# Patient Record
Sex: Female | Born: 1985
Health system: Southern US, Community
[De-identification: ages and names within clinical notes are randomized; demographics above are authoritative.]

## PROBLEM LIST (undated history)

## (undated) DIAGNOSIS — R011 Cardiac murmur, unspecified: Secondary | ICD-10-CM

## (undated) DIAGNOSIS — D649 Anemia, unspecified: Secondary | ICD-10-CM

## (undated) HISTORY — DX: Anemia, unspecified: D64.9

## (undated) HISTORY — PX: TUBAL LIGATION: SHX77

## (undated) HISTORY — DX: Cardiac murmur, unspecified: R01.1

---

## 2017-05-17 ENCOUNTER — Encounter (HOSPITAL_BASED_OUTPATIENT_CLINIC_OR_DEPARTMENT_OTHER): Payer: Self-pay

## 2017-05-17 ENCOUNTER — Other Ambulatory Visit: Payer: Self-pay

## 2017-05-17 DIAGNOSIS — M25512 Pain in left shoulder: Secondary | ICD-10-CM | POA: Diagnosis not present

## 2017-05-17 DIAGNOSIS — Y999 Unspecified external cause status: Secondary | ICD-10-CM | POA: Insufficient documentation

## 2017-05-17 DIAGNOSIS — Y92410 Unspecified street and highway as the place of occurrence of the external cause: Secondary | ICD-10-CM | POA: Diagnosis not present

## 2017-05-17 DIAGNOSIS — S161XXA Strain of muscle, fascia and tendon at neck level, initial encounter: Secondary | ICD-10-CM | POA: Insufficient documentation

## 2017-05-17 DIAGNOSIS — Y9389 Activity, other specified: Secondary | ICD-10-CM | POA: Insufficient documentation

## 2017-05-17 DIAGNOSIS — S199XXA Unspecified injury of neck, initial encounter: Secondary | ICD-10-CM | POA: Diagnosis present
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
## 2017-05-18 ENCOUNTER — Emergency Department (HOSPITAL_BASED_OUTPATIENT_CLINIC_OR_DEPARTMENT_OTHER): Payer: No Typology Code available for payment source

## 2017-05-18 ENCOUNTER — Emergency Department (HOSPITAL_BASED_OUTPATIENT_CLINIC_OR_DEPARTMENT_OTHER)
Admission: EM | Admit: 2017-05-18 | Discharge: 2017-05-18 | Disposition: A | Discharge status: Home / Self Care | Payer: No Typology Code available for payment source | Attending: Emergency Medicine | Admitting: Emergency Medicine

## 2017-05-18 DIAGNOSIS — M25512 Pain in left shoulder: Secondary | ICD-10-CM

## 2017-05-18 DIAGNOSIS — S161XXA Strain of muscle, fascia and tendon at neck level, initial encounter: Secondary | ICD-10-CM

## 2017-05-18 MED ORDER — CYCLOBENZAPRINE HCL 10 MG PO TABS: 10.0000 mg | ORAL_TABLET | Freq: Three times a day (TID) | ORAL | 0 refills | Status: DC | PRN

## 2017-05-18 MED ORDER — IBUPROFEN 400 MG PO TABS
600.0000 mg | ORAL_TABLET | Freq: Once | ORAL | Status: AC
  Administered 2017-05-18: 02:00:00 600 mg via ORAL
  Filled 2017-05-18: qty 1

## 2017-05-18 MED ORDER — CYCLOBENZAPRINE HCL 10 MG PO TABS
10.0000 mg | ORAL_TABLET | Freq: Once | ORAL | Status: AC
  Administered 2017-05-18: 10 mg via ORAL
  Filled 2017-05-18: qty 1

## 2017-05-18 NOTE — Discharge Instructions (Signed)
Apply ice several times a day. Take ibuprofen, naproxen, or acetaminophen as needed for pain. °
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
## 2017-06-01 ENCOUNTER — Encounter (HOSPITAL_BASED_OUTPATIENT_CLINIC_OR_DEPARTMENT_OTHER): Payer: Self-pay | Admitting: Emergency Medicine

## 2017-06-01 ENCOUNTER — Emergency Department (HOSPITAL_BASED_OUTPATIENT_CLINIC_OR_DEPARTMENT_OTHER)
Admission: EM | Admit: 2017-06-01 | Discharge: 2017-06-01 | Disposition: A | Discharge status: Home / Self Care | Payer: Self-pay | Attending: Emergency Medicine | Admitting: Emergency Medicine

## 2017-06-01 ENCOUNTER — Other Ambulatory Visit: Payer: Self-pay

## 2017-06-01 DIAGNOSIS — Y929 Unspecified place or not applicable: Secondary | ICD-10-CM | POA: Insufficient documentation

## 2017-06-01 DIAGNOSIS — Y9389 Activity, other specified: Secondary | ICD-10-CM | POA: Insufficient documentation

## 2017-06-01 DIAGNOSIS — S0501XA Injury of conjunctiva and corneal abrasion without foreign body, right eye, initial encounter: Secondary | ICD-10-CM | POA: Insufficient documentation

## 2017-06-01 DIAGNOSIS — H53141 Visual discomfort, right eye: Secondary | ICD-10-CM | POA: Insufficient documentation

## 2017-06-01 DIAGNOSIS — W504XXA Accidental scratch by another person, initial encounter: Secondary | ICD-10-CM | POA: Insufficient documentation

## 2017-06-01 DIAGNOSIS — Y998 Other external cause status: Secondary | ICD-10-CM | POA: Insufficient documentation

## 2017-06-01 MED ORDER — TETRACAINE HCL 0.5 % OP SOLN
2.0000 [drp] | Freq: Once | OPHTHALMIC | Status: AC
  Administered 2017-06-01: 2 [drp] via OPHTHALMIC
  Filled 2017-06-01: qty 4

## 2017-06-01 MED ORDER — ERYTHROMYCIN 5 MG/GM OP OINT: TOPICAL_OINTMENT | OPHTHALMIC | 0 refills | Status: DC

## 2017-06-01 MED ORDER — FLUORESCEIN SODIUM 1 MG OP STRP
1.0000 | ORAL_STRIP | Freq: Once | OPHTHALMIC | Status: AC
  Administered 2017-06-01: 1 via OPHTHALMIC
  Filled 2017-06-01: qty 1

## 2017-06-01 MED FILL — ERYTHROMYCIN EYE OINTMENT: 5 | 5 days supply | Qty: 4 | Fill #0
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
## 2017-06-01 NOTE — Discharge Instructions (Addendum)
Call and schedule appointment to follow-up with the ophthalmologist today or tomorrow.  Use the erythromycin ointment as prescribed.  Return immediately for any worsening symptoms, changes in your vision or for any concerns.
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
## 2018-04-29 DIAGNOSIS — N898 Other specified noninflammatory disorders of vagina: Secondary | ICD-10-CM | POA: Diagnosis not present

## 2018-06-22 DIAGNOSIS — M531 Cervicobrachial syndrome: Secondary | ICD-10-CM | POA: Diagnosis not present

## 2018-06-22 DIAGNOSIS — M9901 Segmental and somatic dysfunction of cervical region: Secondary | ICD-10-CM | POA: Diagnosis not present

## 2018-06-22 DIAGNOSIS — M5386 Other specified dorsopathies, lumbar region: Secondary | ICD-10-CM | POA: Diagnosis not present

## 2018-06-22 DIAGNOSIS — M9902 Segmental and somatic dysfunction of thoracic region: Secondary | ICD-10-CM | POA: Diagnosis not present

## 2018-06-24 DIAGNOSIS — M5386 Other specified dorsopathies, lumbar region: Secondary | ICD-10-CM | POA: Diagnosis not present

## 2018-06-24 DIAGNOSIS — M531 Cervicobrachial syndrome: Secondary | ICD-10-CM | POA: Diagnosis not present

## 2018-06-24 DIAGNOSIS — M9902 Segmental and somatic dysfunction of thoracic region: Secondary | ICD-10-CM | POA: Diagnosis not present

## 2018-06-24 DIAGNOSIS — M9901 Segmental and somatic dysfunction of cervical region: Secondary | ICD-10-CM | POA: Diagnosis not present

## 2018-06-29 DIAGNOSIS — M531 Cervicobrachial syndrome: Secondary | ICD-10-CM | POA: Diagnosis not present

## 2018-06-29 DIAGNOSIS — M5386 Other specified dorsopathies, lumbar region: Secondary | ICD-10-CM | POA: Diagnosis not present

## 2018-06-29 DIAGNOSIS — M9902 Segmental and somatic dysfunction of thoracic region: Secondary | ICD-10-CM | POA: Diagnosis not present

## 2018-06-29 DIAGNOSIS — M9901 Segmental and somatic dysfunction of cervical region: Secondary | ICD-10-CM | POA: Diagnosis not present

## 2018-07-07 DIAGNOSIS — M9902 Segmental and somatic dysfunction of thoracic region: Secondary | ICD-10-CM | POA: Diagnosis not present

## 2018-07-07 DIAGNOSIS — M5386 Other specified dorsopathies, lumbar region: Secondary | ICD-10-CM | POA: Diagnosis not present

## 2018-07-07 DIAGNOSIS — M9901 Segmental and somatic dysfunction of cervical region: Secondary | ICD-10-CM | POA: Diagnosis not present

## 2018-07-07 DIAGNOSIS — M531 Cervicobrachial syndrome: Secondary | ICD-10-CM | POA: Diagnosis not present

## 2018-07-14 DIAGNOSIS — M9902 Segmental and somatic dysfunction of thoracic region: Secondary | ICD-10-CM | POA: Diagnosis not present

## 2018-07-14 DIAGNOSIS — M531 Cervicobrachial syndrome: Secondary | ICD-10-CM | POA: Diagnosis not present

## 2018-07-14 DIAGNOSIS — M9901 Segmental and somatic dysfunction of cervical region: Secondary | ICD-10-CM | POA: Diagnosis not present

## 2018-07-14 DIAGNOSIS — M5386 Other specified dorsopathies, lumbar region: Secondary | ICD-10-CM | POA: Diagnosis not present

## 2018-07-22 ENCOUNTER — Emergency Department (HOSPITAL_COMMUNITY)
Admission: EM | Admit: 2018-07-22 | Discharge: 2018-07-22 | Disposition: A | Discharge status: Home / Self Care | Payer: BLUE CROSS/BLUE SHIELD | Attending: Emergency Medicine | Admitting: Emergency Medicine

## 2018-07-22 ENCOUNTER — Encounter (HOSPITAL_COMMUNITY): Payer: Self-pay | Admitting: Emergency Medicine

## 2018-07-22 ENCOUNTER — Emergency Department (HOSPITAL_COMMUNITY): Payer: BLUE CROSS/BLUE SHIELD

## 2018-07-22 ENCOUNTER — Other Ambulatory Visit: Payer: Self-pay

## 2018-07-22 DIAGNOSIS — Z03818 Encounter for observation for suspected exposure to other biological agents ruled out: Secondary | ICD-10-CM | POA: Diagnosis not present

## 2018-07-22 DIAGNOSIS — R079 Chest pain, unspecified: Secondary | ICD-10-CM | POA: Diagnosis not present

## 2018-07-22 DIAGNOSIS — Z20828 Contact with and (suspected) exposure to other viral communicable diseases: Secondary | ICD-10-CM | POA: Diagnosis not present

## 2018-07-22 DIAGNOSIS — R0602 Shortness of breath: Secondary | ICD-10-CM | POA: Diagnosis not present

## 2018-07-22 DIAGNOSIS — R0781 Pleurodynia: Secondary | ICD-10-CM | POA: Insufficient documentation

## 2018-07-22 LAB — BASIC METABOLIC PANEL
Anion gap: 12 (ref 5–15)
BUN: 11 mg/dL (ref 6–20)
CO2: 22 mmol/L (ref 22–32)
Calcium: 9.5 mg/dL (ref 8.9–10.3)
Chloride: 104 mmol/L (ref 98–111)
Creatinine, Ser: 1.01 mg/dL — ABNORMAL HIGH (ref 0.44–1.00)
GFR calc Af Amer: >60 mL/min (ref >60)
GFR calc non Af Amer: >60 mL/min (ref >60)
Glucose, Bld: 104 mg/dL — ABNORMAL HIGH (ref 70–99)
Potassium: 3.2 mmol/L — ABNORMAL LOW (ref 3.5–5.1)
Sodium: 138 mmol/L (ref 135–145)

## 2018-07-22 LAB — C-REACTIVE PROTEIN: CRP: <0.8 mg/dL (ref <1.0)

## 2018-07-22 LAB — CBC
HCT: 35.3 % — ABNORMAL LOW (ref 36.0–46.0)
Hemoglobin: 11.8 g/dL — ABNORMAL LOW (ref 12.0–15.0)
MCH: 27.5 pg (ref 26.0–34.0)
MCHC: 33.4 g/dL (ref 30.0–36.0)
MCV: 82.3 fL (ref 80.0–100.0)
Platelets: 283 10*3/uL (ref 150–400)
RBC: 4.29 MIL/uL (ref 3.87–5.11)
RDW: 12.5 % (ref 11.5–15.5)
WBC: 10.3 10*3/uL (ref 4.0–10.5)
nRBC: 0 % (ref 0.0–0.2)

## 2018-07-22 LAB — SEDIMENTATION RATE: Sed Rate: 2 mm/hr (ref 0–22)

## 2018-07-22 LAB — TROPONIN I: Troponin I: <0.03 ng/mL (ref <0.03)

## 2018-07-22 LAB — D-DIMER, QUANTITATIVE (NOT AT ARMC): D-Dimer, Quant: 0.59 ug/mL-FEU — ABNORMAL HIGH (ref 0.00–0.50)

## 2018-07-22 MED ORDER — IOHEXOL 350 MG/ML SOLN
75.0000 mL | Freq: Once | INTRAVENOUS | Status: AC | PRN
  Administered 2018-07-22: 75 mL via INTRAVENOUS

## 2018-07-22 MED ORDER — KETOROLAC TROMETHAMINE 15 MG/ML IJ SOLN
15.0000 mg | Freq: Once | INTRAMUSCULAR | Status: AC
  Administered 2018-07-22: 15 mg via INTRAVENOUS
  Filled 2018-07-22: qty 1

## 2018-07-22 NOTE — ED Provider Notes (Signed)
[facility] EMERGENCY DEPARTMENT Provider Note   CSN: [id number] Arrival date & time: 07/22/18  0740    History   Chief Complaint Chief Complaint  Patient presents with   Shortness of Breath    HPI Sharon Cortez is a 33 y.o. female who presents with chest pain and shortness of breath.  No significant past medical history.  She reports an acute onset of chest pain that started overnight.  It hurts to take deep breaths.  She feels short of breath because she cannot get a deep breath from the pain. It feels better sitting forward, worse lying back. She tried her children's nebulizer treatments with minimal relief.  EMS was called this morning and they told her she had a "low-grade fever" so she took Tylenol. She denies any sick contacts.  She has been working from home and everyone has been well.  She has been visiting her boyfriend in [place] who does work with COVID patients.  She denies URI symptoms, vomiting, diarrhea, urinary symptoms, leg swelling. No recent surgery/travel/immobilization, hx of cancer, leg swelling, hemoptysis, prior DVT/PE, or hormone use.    HPI  History reviewed. No pertinent past medical history.  There are no active problems to display for this patient.   Past Surgical History:  Procedure Laterality Date   CESAREAN SECTION     TUBAL LIGATION       OB History   No obstetric history on file.      Home Medications    Prior to Admission medications   Medication Sig Start Date End Date Taking? Authorizing Provider  cyclobenzaprine (FLEXERIL) 10 MG tablet Take 1 tablet (10 mg total) by mouth 3 (three) times daily as needed for muscle spasms. 05/18/17   Dione Booze, MD  erythromycin ophthalmic ointment Place a 1/2 inch ribbon of ointment into the right lower eyelid 4 times daily x 5 days 06/01/17   Loren Racer, MD    Family History No family history on file.  Social History Social History   Tobacco Use   Smoking status:  Never Smoker   Smokeless tobacco: Never Used  Substance Use Topics   Alcohol use: No    Frequency: Never   Drug use: No     Allergies   Patient has no known allergies.   Review of Systems Review of Systems  Constitutional: Positive for fever. Negative for activity change and appetite change.  Respiratory: Positive for cough and shortness of breath. Negative for wheezing.   Cardiovascular: Positive for chest pain. Negative for palpitations and leg swelling.  Gastrointestinal: Positive for abdominal pain and nausea. Negative for diarrhea and vomiting.  Genitourinary: Negative for dysuria.  Neurological: Negative for syncope.  All other systems reviewed and are negative.    Physical Exam Updated Vital Signs BP (!) 124/91 (BP Location: Left Arm)    Pulse (!) 104    Temp 98.4 F (36.9 C) (Oral)    Resp 20    Ht  (1.676 m)    Wt 59 kg    LMP 07/01/2018    SpO2 99%    BMI 20.98 kg/m   Physical Exam Vitals signs and nursing note reviewed.  Constitutional:      General: She is not in acute distress.    Appearance: Normal appearance. She is well-developed. She is not ill-appearing.     Comments: Calm and cooperative. Well appearing. No distress  HENT:     Head: Normocephalic and atraumatic.  Eyes:  General: No scleral icterus.       Right eye: No discharge.        Left eye: No discharge.     Conjunctiva/sclera: Conjunctivae normal.     Pupils: Pupils are equal, round, and reactive to light.  Neck:     Musculoskeletal: Normal range of motion.  Cardiovascular:     Rate and Rhythm: Tachycardia present.  Pulmonary:     Effort: Pulmonary effort is normal. No respiratory distress.     Breath sounds: Normal breath sounds.  Abdominal:     General: There is no distension.     Palpations: Abdomen is soft.     Tenderness: There is no abdominal tenderness.  Musculoskeletal:     Right lower leg: No edema.     Left lower leg: No edema.  Skin:    General: Skin is warm  and dry.  Neurological:     Mental Status: She is alert and oriented to person, place, and time.  Psychiatric:        Behavior: Behavior normal.      ED Treatments / Results  Labs (all labs ordered are listed, but only abnormal results are displayed) Labs Reviewed  BASIC METABOLIC PANEL - Abnormal; Notable for the following components:      Result Value   Potassium 3.2 (*)    Glucose, Bld 104 (*)    Creatinine, Ser 1.01 (*)    All other components within normal limits  CBC - Abnormal; Notable for the following components:   Hemoglobin 11.8 (*)    HCT 35.3 (*)    All other components within normal limits  D-DIMER, QUANTITATIVE (NOT AT [facility]) - Abnormal; Notable for the following components:   D-Dimer, Quant 0.59 (*)    All other components within normal limits  NOVEL CORONAVIRUS, NAA (HOSPITAL ORDER, SEND-OUT TO REF LAB)  TROPONIN I  SEDIMENTATION RATE  C-REACTIVE PROTEIN    EKG EKG Interpretation  Date/Time:  Friday Jul 22 2018 08:00:49 EDT Ventricular Rate:  108 PR Interval:    QRS Duration: 74 QT Interval:  304 QTC Calculation: 408 R Axis:   68 Text Interpretation:  Sinus tachycardia Borderline T abnormalities, diffuse leads Abnormal ekg Confirmed by Gerhard Munch ([id number]) on 07/22/2018 8:02:45 AM   Radiology Ct Angio Chest Pe W/cm &/or Wo Cm  Result Date: 07/22/2018 CLINICAL DATA:  Shortness of breath for 1 day EXAM: CT ANGIOGRAPHY CHEST WITH CONTRAST TECHNIQUE: Multidetector CT imaging of the chest was performed using the standard protocol during bolus administration of intravenous contrast. Multiplanar CT image reconstructions and MIPs were obtained to evaluate the vascular anatomy. CONTRAST:  36mL OMNIPAQUE IOHEXOL 350 MG/ML SOLN COMPARISON:  None. FINDINGS: Cardiovascular: Satisfactory opacification of the pulmonary arteries to the segmental level. No evidence of pulmonary embolism. Normal heart size. No pericardial effusion. Mediastinum/Nodes: No enlarged  mediastinal, hilar, or axillary lymph nodes. Thyroid gland, trachea, and esophagus demonstrate no significant findings. Lungs/Pleura: Lungs are clear. No pleural effusion or pneumothorax. Upper Abdomen: No acute abnormality. Musculoskeletal: No chest wall abnormality. No acute or significant osseous findings. Review of the MIP images confirms the above findings. IMPRESSION: 1. No evidence of pulmonary embolus. 2. No acute cardiopulmonary disease. Electronically Signed   By: Elige Ko   On: 07/22/2018 12:22   Dg Chest Port 1 View  Result Date: 07/22/2018 CLINICAL DATA:  Chest pain. EXAM: PORTABLE CHEST 1 VIEW COMPARISON:  None. FINDINGS: The heart size and mediastinal contours are within normal limits. Both lungs are clear.  No pneumothorax or pleural effusion is noted. The visualized skeletal structures are unremarkable. IMPRESSION: No active disease. Electronically Signed   By: Lupita RaiderJames  Green Jr M.D.   On: 07/22/2018 08:52    Procedures Procedures (including critical care time)  Medications Ordered in ED Medications - No data to display   Initial Impression / Assessment and Plan / ED Course  I have reviewed the triage vital signs and the nursing notes.  Pertinent labs & imaging results that were available during my care of the patient were reviewed by me and considered in my medical decision making (see chart for details).  33 year old female presents with acute onset of pleuritic chest pain and shortness of breath starting early this morning along with low grade.  The pain is worse with deep breaths and is positional. DDx includes pericarditis, myocarditis, pleurisy, viral lung infection. She is tachycardic but otherwise vital signs are normal.  Her exam is overall unremarkable.  Will attain EKG, chest x-ray, blood work.  We will give Toradol for pain.  EKG is sinus tachycardia.  Chest x-ray is negative.  Blood work is pending.  11:07 AM Rechecked pt. She is feeling better after Toradol.   CBC is remarkable for mild anemia.  BMP is remarkable for mild hypokalemia.  Troponin is normal.  Inflammatory markers are normal.  Her d-dimer was slightly elevated.  Will obtain CTA of the chest to rule out PE.  CT of the chest is negative for PE or pneumonia.  Will treat with NSAIDs for the next several days.  A COVID-19 send test was obtained due to her contact with her boyfriend who is around COVID patients. She was advised to self-isolate until she obtains her results.  Lacy Duverneyyla Spillane was evaluated in Emergency Department on 07/22/2018 for the symptoms described in the history of present illness. She was evaluated in the context of the global COVID-19 pandemic, which necessitated consideration that the patient might be at risk for infection with the SARS-CoV-2 virus that causes COVID-19. Institutional protocols and algorithms that pertain to the evaluation of patients at risk for COVID-19 are in a state of rapid change based on information released by regulatory bodies including the CDC and federal and state organizations. These policies and algorithms were followed during the patient's care in the ED.   Final Clinical Impressions(s) / ED Diagnoses   Final diagnoses:  Pleuritic chest pain    ED Discharge Orders    None       Bethel BornGekas, Laiana Fratus Marie, PA-C 07/22/18 1446    Gerhard MunchLockwood, Robert, MD 07/22/18 1546

## 2018-07-22 NOTE — Discharge Instructions (Addendum)
All your testing that was done in the Emergency Dept today came back reassuring Take Ibuprofen 600mg  three times a day for the next 5 days for chest pain Rest and drink plenty of fluids We have sent off a Coronavirus test - the results will be available in 2-3 days. Please self-quarantine at home until you obtain the results Return if worsening

## 2018-07-22 NOTE — ED Notes (Signed)
This RN called main lab to check on process of D-dimer. Lab will now be processed,

## 2018-07-22 NOTE — ED Notes (Signed)
Gave pt crackers and gingerale 

## 2018-07-22 NOTE — ED Notes (Signed)
Pt states she is able to call family and keep them informed on her care.

## 2018-07-22 NOTE — ED Triage Notes (Signed)
Patient c/o central chest pain with inspiration onset of last night. Patient states she took one of her children's nebulizer treatments with mild relief. Patient states EMS came and saw her this am and reported a low grade fever and patient took 2 tylenol around 630 am.

## 2018-07-22 NOTE — ED Notes (Signed)
Pt arranged her family to come pick her up for D./c

## 2018-07-23 LAB — NOVEL CORONAVIRUS, NAA (HOSP ORDER, SEND-OUT TO REF LAB; TAT 18-24 HRS): SARS-CoV-2, NAA: NOT DETECTED

## 2018-07-29 DIAGNOSIS — M9901 Segmental and somatic dysfunction of cervical region: Secondary | ICD-10-CM | POA: Diagnosis not present

## 2018-07-29 DIAGNOSIS — M531 Cervicobrachial syndrome: Secondary | ICD-10-CM | POA: Diagnosis not present

## 2018-07-29 DIAGNOSIS — M5386 Other specified dorsopathies, lumbar region: Secondary | ICD-10-CM | POA: Diagnosis not present

## 2018-07-29 DIAGNOSIS — M9902 Segmental and somatic dysfunction of thoracic region: Secondary | ICD-10-CM | POA: Diagnosis not present

## 2018-08-12 DIAGNOSIS — M9901 Segmental and somatic dysfunction of cervical region: Secondary | ICD-10-CM | POA: Diagnosis not present

## 2018-08-12 DIAGNOSIS — M531 Cervicobrachial syndrome: Secondary | ICD-10-CM | POA: Diagnosis not present

## 2018-08-12 DIAGNOSIS — M9902 Segmental and somatic dysfunction of thoracic region: Secondary | ICD-10-CM | POA: Diagnosis not present

## 2018-08-12 DIAGNOSIS — M5386 Other specified dorsopathies, lumbar region: Secondary | ICD-10-CM | POA: Diagnosis not present

## 2018-08-19 DIAGNOSIS — Z1151 Encounter for screening for human papillomavirus (HPV): Secondary | ICD-10-CM | POA: Diagnosis not present

## 2018-08-19 DIAGNOSIS — Z124 Encounter for screening for malignant neoplasm of cervix: Secondary | ICD-10-CM | POA: Diagnosis not present

## 2018-08-19 DIAGNOSIS — Z113 Encounter for screening for infections with a predominantly sexual mode of transmission: Secondary | ICD-10-CM | POA: Diagnosis not present

## 2018-08-19 DIAGNOSIS — Z01419 Encounter for gynecological examination (general) (routine) without abnormal findings: Secondary | ICD-10-CM | POA: Diagnosis not present

## 2019-08-22 ENCOUNTER — Other Ambulatory Visit: Payer: Self-pay

## 2019-08-23 ENCOUNTER — Ambulatory Visit (INDEPENDENT_AMBULATORY_CARE_PROVIDER_SITE_OTHER): Payer: BC Managed Care – PPO | Admitting: Obstetrics and Gynecology

## 2019-08-23 ENCOUNTER — Encounter: Payer: Self-pay | Admitting: Obstetrics and Gynecology

## 2019-08-23 ENCOUNTER — Other Ambulatory Visit (HOSPITAL_COMMUNITY)
Admission: RE | Admit: 2019-08-23 | Discharge: 2019-08-23 | Disposition: A | Discharge status: Home / Self Care | Payer: Self-pay | Source: Ambulatory Visit | Attending: Obstetrics and Gynecology | Admitting: Obstetrics and Gynecology

## 2019-08-23 VITALS — BP 104/64 | HR 76 | Temp 97.1°F | Resp 16 | Ht 66.0 in | Wt 136.0 lb

## 2019-08-23 DIAGNOSIS — N649 Disorder of breast, unspecified: Secondary | ICD-10-CM | POA: Diagnosis not present

## 2019-08-23 DIAGNOSIS — R1032 Left lower quadrant pain: Secondary | ICD-10-CM

## 2019-08-23 DIAGNOSIS — Z01419 Encounter for gynecological examination (general) (routine) without abnormal findings: Secondary | ICD-10-CM | POA: Diagnosis not present

## 2019-08-23 DIAGNOSIS — Z113 Encounter for screening for infections with a predominantly sexual mode of transmission: Secondary | ICD-10-CM | POA: Diagnosis not present

## 2019-08-23 DIAGNOSIS — L988 Other specified disorders of the skin and subcutaneous tissue: Secondary | ICD-10-CM

## 2019-08-23 NOTE — Progress Notes (Signed)
34 y.o. G60P4 Single African American female here for annual exam.    Patient complaining of vaginal itching and occ. LLQ discomfort.  Patient would also like STD testing.  Notices itching for a few weeks.  No discharge or odor.  Taking probiotics.   Hx yeast infections but not recurrent.   Uses Dove soap.   Has sharp shooting pain in LLQ, more with ovulation pain.  Pain is only on the left.  No dysuria.  No hematuria.  BMs are not regular and occur three times a week.  Works for [employer].  Children 3, 8, and 60 yo. Has a boyfriend.   PCP:  None   Patient's last menstrual period was 08/07/2019 (approximate).     Period Cycle (Days): 30 Period Duration (Days): 5-7 days Period Pattern: Regular Menstrual Flow:  (every other month heavy) Menstrual Control: Tampon, Maxi pad Menstrual Control Change Freq (Hours): every 2 hours on heaviest day Dysmenorrhea: (!) Moderate Dysmenorrhea Symptoms: Cramping, Headache     Sexually active: Yes.    The current method of family planning is tubal ligation.    Exercising: Yes.    works out at gym Smoker:  no  Health Maintenance: Pap: 2020 normal History of abnormal Pap:  Yes, 10 years ago had abnormal pap w/ negative colposcopy--no treatment. MMG:  n/a Colonoscopy:  n/a BMD:   n/a  Result  n/a TDaP:  Unsure.   Gardasil:   no HIV: Neg in the past Hep C:neg in the past Screening Labs:   Today.    reports that she has never smoked. She has never used smokeless tobacco. She reports current alcohol use. She reports that she does not use drugs.  Past Medical History:  Diagnosis Date   Anemia    Heart murmur     Past Surgical History:  Procedure Laterality Date   CESAREAN SECTION  2017   TUBAL LIGATION      No current outpatient medications on file.   No current facility-administered medications for this visit.    Family History  Problem Relation Age of Onset   Stroke Mother    Diabetes Father     Hypertension Father    Hyperlipidemia Father    Seizures Paternal Aunt    Diabetes Paternal Grandmother     Review of Systems  Genitourinary: Positive for pelvic pain (occ. LLQ discomfort).  All other systems reviewed and are negative.   Exam:   BP 104/64    Pulse 76    Temp (!) 97.1 F (36.2 C) (Temporal)    Resp 16    Ht 5\' 6"  (1.676 m)    Wt 136 lb (61.7 kg)    LMP 08/07/2019 (Approximate)    BMI 21.95 kg/m     General appearance: alert, cooperative and appears stated age Head: normocephalic, without obvious abnormality, atraumatic Neck: no adenopathy, supple, symmetrical, trachea midline and thyroid normal to inspection and palpation Lungs: clear to auscultation bilaterally Breasts: right - normal appearance, no masses or tenderness, No nipple retraction or dimpling, No nipple discharge or bleeding, No axillary adenopathy Left with verrucous skin lesion of her left superior breast - 2 x 1 cm.   Heart: regular rate and rhythm Abdomen: soft, non-tender; no masses, no organomegaly Extremities: extremities normal, atraumatic, no cyanosis or edema Skin: skin color, texture, turgor normal. No rashes or lesions Lymph nodes: cervical, supraclavicular, and axillary nodes normal. Neurologic: grossly normal  Pelvic: External genitalia:  no lesions  No abnormal inguinal nodes palpated.              Urethra:  normal appearing urethra with no masses, tenderness or lesions              Bartholins and Skenes: normal                 Vagina: normal appearing vagina with normal color and discharge, no lesions              Cervix: no lesions              Pap taken: Yes.   Bimanual Exam:  Uterus:  normal size, contour, position, consistency, mobility, non-tender              Adnexa: no mass, fullness, tenderness on right.  Left adnexal/left sided uterine tenderness on exam.  No mass.                Rectal exam:  Confirms above.             Chaperone was present for  exam.  Assessment:   Well woman visit with normal exam. Status post BTL.  Vaginitis.  LLQ pain.  Constipation? Left breast skin lesion.   Plan: Mammogram screening age 47. Self breast awareness reviewed. Pap and HR HPV as above. Guidelines for Calcium, Vitamin D, regular exercise program including cardiovascular and weight bearing exercise. Routine labs, STD screening, vaginitis testing.  We reviewed a healthy diet to reduce constipation.  Return for pelvic ultrasound.  Will refer to breast surgeon for possible excision.  Follow up annually and prn.

## 2019-08-23 NOTE — Patient Instructions (Addendum)
EXERCISE AND DIET:  We recommended that you start or continue a regular exercise program for good health. Regular exercise means any activity that makes your heart beat faster and makes you sweat.  We recommend exercising at least 30 minutes per day at least 3 days a week, preferably 4 or 5.  We also recommend a diet low in fat and sugar.  Inactivity, poor dietary choices and obesity can cause diabetes, heart attack, stroke, and kidney damage, among others.    ALCOHOL AND SMOKING:  Women should limit their alcohol intake to no more than 7 drinks/beers/glasses of wine (combined, not each!) per week. Moderation of alcohol intake to this level decreases your risk of breast cancer and liver damage. And of course, no recreational drugs are part of a healthy lifestyle.  And absolutely no smoking or even second hand smoke. Most people know smoking can cause heart and lung diseases, but did you know it also contributes to weakening of your bones? Aging of your skin?  Yellowing of your teeth and nails?  CALCIUM AND VITAMIN D:  Adequate intake of calcium and Vitamin D are recommended.  The recommendations for exact amounts of these supplements seem to change often, but generally speaking 600 mg of calcium (either carbonate or citrate) and 800 units of Vitamin D per day seems prudent. Certain women may benefit from higher intake of Vitamin D.  If you are among these women, your doctor will have told you during your visit.    PAP SMEARS:  Pap smears, to check for cervical cancer or precancers,  have traditionally been done yearly, although recent scientific advances have shown that most women can have pap smears less often.  However, every woman still should have a physical exam from her gynecologist every year. It will include a breast check, inspection of the vulva and vagina to check for abnormal growths or skin changes, a visual exam of the cervix, and then an exam to evaluate the size and shape of the uterus and  ovaries.  And after 34 years of age, a rectal exam is indicated to check for rectal cancers. We will also provide age appropriate advice regarding health maintenance, like when you should have certain vaccines, screening for sexually transmitted diseases, bone density testing, colonoscopy, mammograms, etc.   MAMMOGRAMS:  All women over 40 years old should have a yearly mammogram. Many facilities now offer a "3D" mammogram, which may cost around $50 extra out of pocket. If possible,  we recommend you accept the option to have the 3D mammogram performed.  It both reduces the number of women who will be called back for extra views which then turn out to be normal, and it is better than the routine mammogram at detecting truly abnormal areas.    COLONOSCOPY:  Colonoscopy to screen for colon cancer is recommended for all women at age 50.  We know, you hate the idea of the prep.  We agree, BUT, having colon cancer and not knowing it is worse!!  Colon cancer so often starts as a polyp that can be seen and removed at colonscopy, which can quite literally save your life!  And if your first colonoscopy is normal and you have no family history of colon cancer, most women don't have to have it again for 10 years.  Once every ten years, you can do something that may end up saving your life, right?  We will be happy to help you get it scheduled when you are ready.    Be sure to check your insurance coverage so you understand how much it will cost.  It may be covered as a preventative service at no cost, but you should check your particular policy.      Constipation, Adult Constipation is when a person has fewer bowel movements in a week than normal, has difficulty having a bowel movement, or has stools that are dry, hard, or larger than normal. Constipation may be caused by an underlying condition. It may become worse with age if a person takes certain medicines and does not take in enough fluids. Follow these  instructions at home: Eating and drinking   Eat foods that have a lot of fiber, such as fresh fruits and vegetables, whole grains, and beans.  Limit foods that are high in fat, low in fiber, or overly processed, such as french fries, hamburgers, cookies, candies, and soda.  Drink enough fluid to keep your urine clear or pale yellow. General instructions  Exercise regularly or as told by your health care provider.  Go to the restroom when you have the urge to go. Do not hold it in.  Take over-the-counter and prescription medicines only as told by your health care provider. These include any fiber supplements.  Practice pelvic floor retraining exercises, such as deep breathing while relaxing the lower abdomen and pelvic floor relaxation during bowel movements.  Watch your condition for any changes.  Keep all follow-up visits as told by your health care provider. This is important. Contact a health care provider if:  You have pain that gets worse.  You have a fever.  You do not have a bowel movement after 4 days.  You vomit.  You are not hungry.  You lose weight.  You are bleeding from the anus.  You have thin, pencil-like stools. Get help right away if:  You have a fever and your symptoms suddenly get worse.  You leak stool or have blood in your stool.  Your abdomen is bloated.  You have severe pain in your abdomen.  You feel dizzy or you faint. This information is not intended to replace advice given to you by your health care provider. Make sure you discuss any questions you have with your health care provider. Document Revised: 01/29/2017 Document Reviewed: 08/07/2015 Elsevier Patient Education  2020 [employer].

## 2019-08-24 ENCOUNTER — Telehealth: Payer: Self-pay | Admitting: Obstetrics and Gynecology

## 2019-08-24 LAB — CERVICOVAGINAL ANCILLARY ONLY
Bacterial Vaginitis (gardnerella): NEGATIVE
Candida Glabrata: NEGATIVE
Candida Vaginitis: NEGATIVE
Chlamydia: NEGATIVE
Comment: NEGATIVE
Comment: NEGATIVE
Comment: NEGATIVE
Comment: NEGATIVE
Comment: NEGATIVE
Comment: NORMAL
Neisseria Gonorrhea: NEGATIVE
Trichomonas: NEGATIVE

## 2019-08-24 LAB — COMPREHENSIVE METABOLIC PANEL
ALT: 15 IU/L (ref 0–32)
AST: 19 IU/L (ref 0–40)
Albumin/Globulin Ratio: 1.7 (ref 1.2–2.2)
Albumin: 4.7 g/dL (ref 3.8–4.8)
Alkaline Phosphatase: 30 IU/L — ABNORMAL LOW (ref 48–121)
BUN/Creatinine Ratio: 17 (ref 9–23)
BUN: 15 mg/dL (ref 6–20)
Bilirubin Total: 0.2 mg/dL (ref 0.0–1.2)
CO2: 24 mmol/L (ref 20–29)
Calcium: 10.4 mg/dL — ABNORMAL HIGH (ref 8.7–10.2)
Chloride: 98 mmol/L (ref 96–106)
Creatinine, Ser: 0.87 mg/dL (ref 0.57–1.00)
GFR calc Af Amer: 101 mL/min/{1.73_m2} (ref >59)
GFR calc non Af Amer: 88 mL/min/{1.73_m2} (ref >59)
Globulin, Total: 2.8 g/dL (ref 1.5–4.5)
Glucose: 85 mg/dL (ref 65–99)
Potassium: 4.1 mmol/L (ref 3.5–5.2)
Sodium: 139 mmol/L (ref 134–144)
Total Protein: 7.5 g/dL (ref 6.0–8.5)

## 2019-08-24 LAB — CBC
Hematocrit: 38.6 % (ref 34.0–46.6)
Hemoglobin: 12.8 g/dL (ref 11.1–15.9)
MCH: 28.2 pg (ref 26.6–33.0)
MCHC: 33.2 g/dL (ref 31.5–35.7)
MCV: 85 fL (ref 79–97)
Platelets: 373 10*3/uL (ref 150–450)
RBC: 4.54 x10E6/uL (ref 3.77–5.28)
RDW: 12.7 % (ref 11.7–15.4)
WBC: 4.8 10*3/uL (ref 3.4–10.8)

## 2019-08-24 LAB — LIPID PANEL
Chol/HDL Ratio: 4 ratio (ref 0.0–4.4)
Cholesterol, Total: 206 mg/dL — ABNORMAL HIGH (ref 100–199)
HDL: 51 mg/dL (ref >39)
LDL Chol Calc (NIH): 127 mg/dL — ABNORMAL HIGH (ref 0–99)
Triglycerides: 157 mg/dL — ABNORMAL HIGH (ref 0–149)
VLDL Cholesterol Cal: 28 mg/dL (ref 5–40)

## 2019-08-24 LAB — TSH: TSH: 1.09 u[IU]/mL (ref 0.450–4.500)

## 2019-08-24 LAB — HEP, RPR, HIV PANEL
HIV Screen 4th Generation wRfx: NONREACTIVE
Hepatitis B Surface Ag: NEGATIVE
RPR Ser Ql: NONREACTIVE

## 2019-08-24 LAB — HEPATITIS C ANTIBODY: Hep C Virus Ab: <0.1 s/co ratio (ref 0.0–0.9)

## 2019-08-24 NOTE — Telephone Encounter (Signed)
Spoke with patient regarding benefits for recommended ultrasound. Patient is aware that ultrasound is transvaginal. Patient acknowledges understanding of information presented. Patient is aware of cancellation policy. Patient scheduled appointment for 09/07/2019 at 1030AM with Brook A. Edward Jolly, MD, [facility]. Encounter closed.

## 2019-08-25 LAB — CYTOLOGY - PAP
Comment: NEGATIVE
Diagnosis: UNDETERMINED — AB
High risk HPV: POSITIVE — AB

## 2019-08-30 ENCOUNTER — Telehealth: Payer: Self-pay | Admitting: *Deleted

## 2019-08-30 DIAGNOSIS — R87610 Atypical squamous cells of undetermined significance on cytologic smear of cervix (ASC-US): Secondary | ICD-10-CM

## 2019-08-30 NOTE — Telephone Encounter (Signed)
-----   Message from Patton Salles, MD sent at 08/30/2019  6:11 AM EDT ----- Please contact patient with results of pap showing ASCUS and positive HR HPV.  She will need precert and scheduling of colposcopy with me.  She has a history of an abnormal pap in the past and has had colposcopy previously.

## 2019-08-30 NOTE — Telephone Encounter (Signed)
Leda Min, RN  08/30/2019 9:42 AM EDT Back to Top    Left message to call Noreene Larsson, RN at [facility] [phone].

## 2019-09-07 ENCOUNTER — Ambulatory Visit (INDEPENDENT_AMBULATORY_CARE_PROVIDER_SITE_OTHER): Payer: BC Managed Care – PPO | Admitting: Obstetrics and Gynecology

## 2019-09-07 ENCOUNTER — Encounter: Payer: Self-pay | Admitting: Obstetrics and Gynecology

## 2019-09-07 ENCOUNTER — Other Ambulatory Visit: Payer: Self-pay

## 2019-09-07 ENCOUNTER — Ambulatory Visit (INDEPENDENT_AMBULATORY_CARE_PROVIDER_SITE_OTHER): Payer: BC Managed Care – PPO

## 2019-09-07 VITALS — BP 100/60 | HR 76 | Ht 66.0 in | Wt 136.0 lb

## 2019-09-07 DIAGNOSIS — R1032 Left lower quadrant pain: Secondary | ICD-10-CM | POA: Diagnosis not present

## 2019-09-07 DIAGNOSIS — N940 Mittelschmerz: Secondary | ICD-10-CM | POA: Diagnosis not present

## 2019-09-07 DIAGNOSIS — R87810 Cervical high risk human papillomavirus (HPV) DNA test positive: Secondary | ICD-10-CM

## 2019-09-07 DIAGNOSIS — R87610 Atypical squamous cells of undetermined significance on cytologic smear of cervix (ASC-US): Secondary | ICD-10-CM | POA: Diagnosis not present

## 2019-09-07 NOTE — Progress Notes (Signed)
GYNECOLOGY  VISIT   HPI: 34 y.o.   Single  African American  female   G4P4 with Patient's last menstrual period was 08/31/2019.   here for pelvic ultrasound for LLQ pain and dysmenorrhea.   Pain is usually with ovulation and prior to and after ovulation.  This is her baseline pain.  She can also have pain with intercourse and needs to change position.   States she has vomiting with birth control use.   Has irregular BMs.  She tested negative for gonorrhea, chlamydia and trichomonas.   She had a recent abnormal pap with ASCUS and positive HR HPV.  GYNECOLOGIC HISTORY: Patient's last menstrual period was 08/31/2019. Contraception: Tubal Menopausal hormone therapy:  n/a Last mammogram:  n/a Last pap smear: 08-23-19 ASCUS:Pos HR HPV, July, 2020 normal per patient.  Pap normal and negative HR HPV She has a history of abnormal pap in the past and had colposcopy 10 - 11 years ago.         OB History    Gravida  4   Para  4   Term      Preterm      AB      Living  4     SAB      TAB      Ectopic      Multiple      Live Births                 There are no problems to display for this patient.   Past Medical History:  Diagnosis Date   Anemia    Heart murmur     Past Surgical History:  Procedure Laterality Date   CESAREAN SECTION  2017   TUBAL LIGATION      No current outpatient medications on file.   No current facility-administered medications for this visit.     ALLERGIES: Patient has no known allergies.  Family History  Problem Relation Age of Onset   Stroke Mother    Diabetes Father    Hypertension Father    Hyperlipidemia Father    Seizures Paternal Aunt    Diabetes Paternal Grandmother     Social History   Socioeconomic History   Marital status: Single    Spouse name: Not on file   Number of children: Not on file   Years of education: Not on file   Highest education level: Not on file  Occupational History    Not on file  Tobacco Use   Smoking status: Never Smoker   Smokeless tobacco: Never Used  Vaping Use   Vaping Use: Never used  Substance and Sexual Activity   Alcohol use: Yes    Comment: 2 drinks/month   Drug use: No   Sexual activity: Yes    Birth control/protection: Surgical    Comment: tubal  Other Topics Concern   Not on file  Social History Narrative   Not on file   Social Determinants of Health   Financial Resource Strain:    Difficulty of Paying Living Expenses:   Food Insecurity:    Worried About Programme researcher, broadcasting/film/video in the Last Year:    Barista in the Last Year:   Transportation Needs:    Freight forwarder (Medical):    Lack of Transportation (Non-Medical):   Physical Activity:    Days of Exercise per Week:    Minutes of Exercise per Session:   Stress:    Feeling of Stress :  Social Connections:    Frequency of Communication with Friends and Family:    Frequency of Social Gatherings with Friends and Family:    Attends Religious Services:    Active Member of Clubs or Organizations:    Attends Engineer, structural:    Marital Status:   Intimate Partner Violence:    Fear of Current or Ex-Partner:    Emotionally Abused:    Physically Abused:    Sexually Abused:     Review of Systems  All other systems reviewed and are negative.   PHYSICAL EXAMINATION:    BP 100/60    Pulse 76    Ht 5\' 6"  (1.676 m)    Wt 136 lb (61.7 kg)    LMP 08/31/2019    BMI 21.95 kg/m     General appearance: alert, cooperative and appears stated age  Pelvic 11/01/2019 Uterus no masses.  EMS 6.83 mm.  Ovaries normal.  No free fluid.   ASSESSMENT  LLQ pain.  Normal pelvic US.  ASCUS pap with positive HR HPV.   PLAN  We reviewed her pelvic US images and report.  We discussed potential causes of pain which can not be seen with [place] - fibrosis, small endometrial implants. She is ok doing observational management of her pain. She will  take Ibuprofen prn. She will take OTC.  We discussed her abnormal pap and positive HR HPV status.  Return for colposcopy.  We discussed Gardasil.   _28_____ minute consultation.

## 2019-09-08 NOTE — Telephone Encounter (Signed)
-----   Message from Patton Salles, MD sent at 09/07/2019 12:57 PM EDT ----- I have discussed results with the patient at her office visit today and have placed an order for colposcopy.

## 2019-09-08 NOTE — Telephone Encounter (Signed)
Left message for pt to return call to triage RN.  Colpo orders placed.

## 2019-09-11 NOTE — Telephone Encounter (Signed)
Left message to call Nia Nathaniel, RN at [facility] [phone].   

## 2019-09-13 NOTE — Telephone Encounter (Signed)
Left message for pt to return call to triage RN. 

## 2019-09-13 NOTE — Telephone Encounter (Signed)
Call placed to convey benefits. Spoke with the patient and conveyed the benefits. Patient understands/agreeable with the benefits.   Patient states she cannot take any more time off work right now. She may be able to do this some time in August. Please call the patient for scheduling.

## 2019-09-21 ENCOUNTER — Telehealth: Payer: Self-pay

## 2019-09-21 NOTE — Telephone Encounter (Signed)
See open telephone encounter dated 09/08/19.   Encounter closed.

## 2019-09-21 NOTE — Telephone Encounter (Signed)
Spoke with patient. Patient is requesting to start OCP to help with pain during ovulation. LMP 08/31/19. Hx BTL. Requesting to schedule colpo end of August.  Colpo scheduled for 11/09/19 at 4pm with Dr. Edward Jolly. Advised patient if menses is late  or she is experiencing heavy bleeding day prior to colpo, return call to office to advise, may need to reschedule. Patient will plan to discuss OCP with Dr. Edward Jolly day of colpo. Advised to take Motrin 800 mg with food and water one hour before procedure. Cancellation policy reviewed. Patient verbalizes understanding and is agreeable.   Routing to provider for final review. Patient is agreeable to disposition. Will close encounter.   Cc: [place] [place], [address] [place]

## 2019-09-21 NOTE — Telephone Encounter (Signed)
Argie Ramming E, CMA 3 minutes ago (9:04 AM)     Patient called in regards to wanting a birth control prescription to be called in.

## 2019-09-21 NOTE — Telephone Encounter (Signed)
Patient called in regards to wanting a birth control prescription to be called in.

## 2019-11-08 NOTE — Progress Notes (Deleted)
  Subjective:     Patient ID: Sharon Cortez, female   DOB: Nov 27, 1985, 34 y.o.   MRN: [id number]  HPI  Patient here today for colposcopy with pap 08-23-19 ASCUS:Pos HR HPV.  History of abnormal pap 10 years ago with negative colposcopy--no treatment per patient.   Review of Systems  LMP: Contraception: Tubal UPT:     Objective:   Physical Exam     Assessment:     ***    Plan:     ***

## 2019-11-09 ENCOUNTER — Ambulatory Visit: Payer: Self-pay | Admitting: Obstetrics and Gynecology

## 2019-11-09 ENCOUNTER — Telehealth: Payer: Self-pay | Admitting: Obstetrics and Gynecology

## 2019-11-09 DIAGNOSIS — R87610 Atypical squamous cells of undetermined significance on cytologic smear of cervix (ASC-US): Secondary | ICD-10-CM

## 2019-11-09 NOTE — Telephone Encounter (Signed)
Patient called to cancel her appointment for today for colposcopy. Patient is aware of the cancellation policy. Patient did not ant to reschedule.

## 2019-11-16 NOTE — Telephone Encounter (Signed)
Please contact patient to reschedule colpo.

## 2019-11-17 NOTE — Telephone Encounter (Signed)
Left message to call Noreene Larsson, RN at [facility] [phone].   08/23/19 Pap ASCUS, positive HPV Hx abnormal pap

## 2019-11-21 NOTE — Telephone Encounter (Signed)
Spoke with patient. Patient request to speak with business office prior to scheduling colpo. Patient request to schedule with another provider, new order placed for colpo.   Routing to [employer] for return call.   Cc: Dr. Edward Jolly, Dr. Oscar La

## 2019-11-21 NOTE — Telephone Encounter (Signed)
Call placed to the patient to convey benefits and discuss financial arrangements available .Left message to return a call to [facility].

## 2019-12-16 NOTE — Telephone Encounter (Signed)
Chart to nursing supervisor for review.  No colposcopy scheduled to date for abnormal pap.

## 2019-12-18 NOTE — Telephone Encounter (Signed)
Routing to [facility] for review to reach out to the patient to discuss benefits as Clotilde Dieter is out of the office.

## 2019-12-25 NOTE — Telephone Encounter (Signed)
Call to patient. Patient stated that she was currently in the middle of a family emergency and is unable to talk. Patient stated that she would call back at a better time.  Cc: Dr. Edward Jolly and Yvonna Alanis for FYI.

## 2020-01-02 NOTE — Telephone Encounter (Signed)
Please try to reach patient to schedule colposcopy.  If no response, 30 day letter is appropriate.

## 2020-01-04 NOTE — Telephone Encounter (Signed)
Removed from recall and order cancelled. Encounter closed.

## 2020-01-04 NOTE — Telephone Encounter (Signed)
Spoke with patient. Patient states that she has decided to switch GYN practices. Patient will call when she has established care for records to be sent.  Dr.Silva, okay to remove from recall?

## 2020-01-04 NOTE — Telephone Encounter (Signed)
OK to remove from pap recall.

## 2020-02-03 DIAGNOSIS — J029 Acute pharyngitis, unspecified: Secondary | ICD-10-CM | POA: Diagnosis not present

## 2020-10-19 IMAGING — CT CT ANGIOGRAPHY CHEST
1 of 6 series · 3 of 16 positions shown · IV contrast (omnipaque)
Comparison: None.

CLINICAL DATA: Shortness of breath for 1 day

EXAM:
CT ANGIOGRAPHY CHEST WITH CONTRAST
TECHNIQUE: Multidetector CT imaging of the chest was performed using the
standard protocol during bolus administration of intravenous
contrast. Multiplanar CT image reconstructions and MIPs were
obtained to evaluate the vascular anatomy.
CONTRAST:  75mL OMNIPAQUE IOHEXOL 350 MG/ML SOLN

[Series 7: pe thins · axial · 0.69mm/px · z∈[+1224,+1356]mm · 3 of 376 slices shown]
[im 94/376  lung]
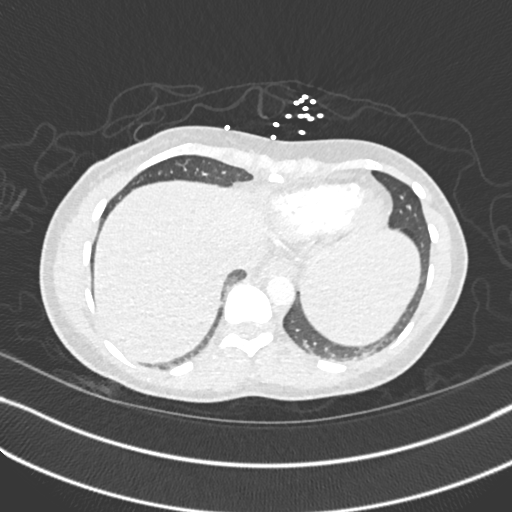
[im 188/376  soft-tissue]
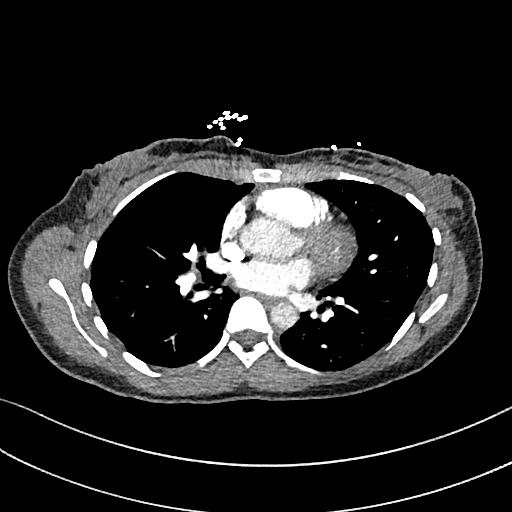
[im 282/376  lung]
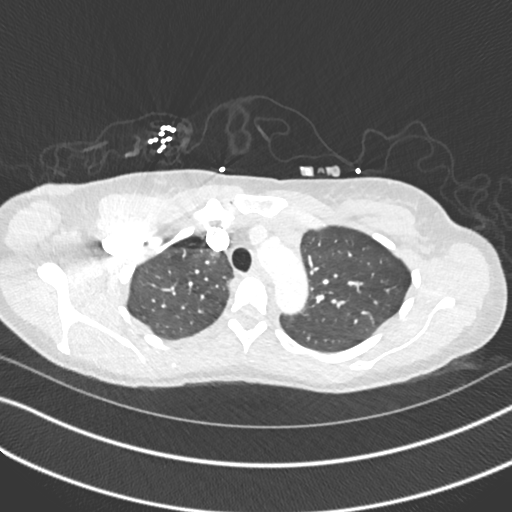

[3 of 16 positions shown; findings below may reference images not displayed]

FINDINGS: Cardiovascular: Satisfactory opacification of the pulmonary arteries
to the segmental level. No evidence of pulmonary embolism. Normal
heart size. No pericardial effusion.

Mediastinum/Nodes: No enlarged mediastinal, hilar, or axillary lymph
nodes. Thyroid gland, trachea, and esophagus demonstrate no
significant findings.

Lungs/Pleura: Lungs are clear. No pleural effusion or pneumothorax.

Upper Abdomen: No acute abnormality.

Musculoskeletal: No chest wall abnormality. No acute or significant
osseous findings.

Review of the MIP images confirms the above findings.
IMPRESSION: 1. No evidence of pulmonary embolus.
2. No acute cardiopulmonary disease.

## 2020-10-19 IMAGING — DX PORTABLE CHEST - 1 VIEW
1 series · 1 of 1 positions shown · non-contrast
Comparison: None.

CLINICAL DATA: Chest pain.

EXAM:
PORTABLE CHEST 1 VIEW

[chest ap]
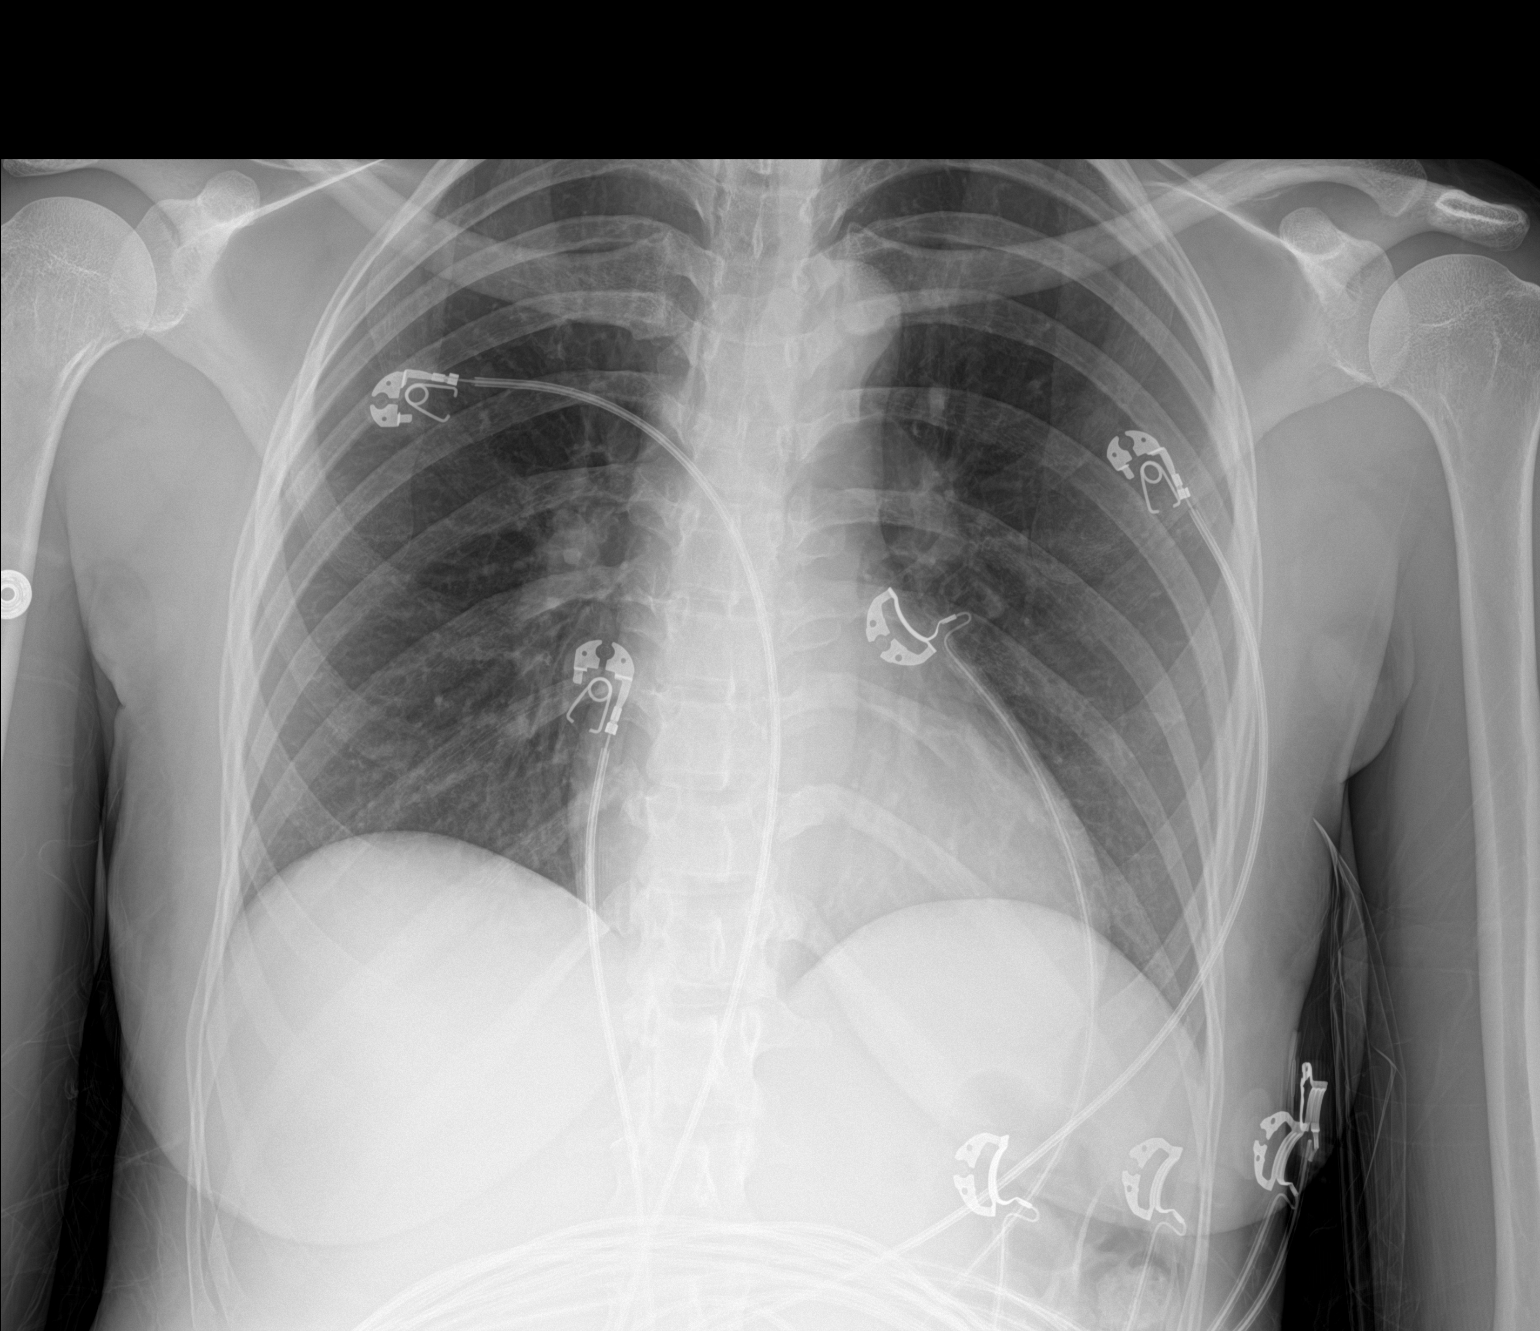

[1 of 1 positions shown; findings below may reference images not displayed]

FINDINGS: The heart size and mediastinal contours are within normal limits.
Both lungs are clear. No pneumothorax or pleural effusion is noted.
The visualized skeletal structures are unremarkable.
IMPRESSION: No active disease.

## 2020-10-28 DIAGNOSIS — R109 Unspecified abdominal pain: Secondary | ICD-10-CM | POA: Diagnosis not present

## 2020-10-28 DIAGNOSIS — N8312 Corpus luteum cyst of left ovary: Secondary | ICD-10-CM | POA: Diagnosis not present

## 2020-10-28 DIAGNOSIS — N854 Malposition of uterus: Secondary | ICD-10-CM | POA: Diagnosis not present

## 2020-10-28 DIAGNOSIS — M545 Low back pain, unspecified: Secondary | ICD-10-CM | POA: Diagnosis not present

## 2020-10-28 DIAGNOSIS — N281 Cyst of kidney, acquired: Secondary | ICD-10-CM | POA: Diagnosis not present

## 2020-10-28 DIAGNOSIS — N83202 Unspecified ovarian cyst, left side: Secondary | ICD-10-CM | POA: Diagnosis not present

## 2020-10-28 DIAGNOSIS — R1032 Left lower quadrant pain: Secondary | ICD-10-CM | POA: Diagnosis not present

## 2020-11-06 DIAGNOSIS — K429 Umbilical hernia without obstruction or gangrene: Secondary | ICD-10-CM | POA: Diagnosis not present

## 2020-11-22 ENCOUNTER — Ambulatory Visit
Admission: EM | Admit: 2020-11-22 | Discharge: 2020-11-22 | Disposition: A | Discharge status: Home / Self Care | Payer: BC Managed Care – PPO | Attending: Emergency Medicine | Admitting: Emergency Medicine

## 2020-11-22 ENCOUNTER — Other Ambulatory Visit: Payer: Self-pay

## 2020-11-22 DIAGNOSIS — J209 Acute bronchitis, unspecified: Secondary | ICD-10-CM | POA: Diagnosis not present

## 2020-11-22 DIAGNOSIS — R059 Cough, unspecified: Secondary | ICD-10-CM | POA: Diagnosis not present

## 2020-11-22 DIAGNOSIS — J00 Acute nasopharyngitis [common cold]: Secondary | ICD-10-CM

## 2020-11-22 MED ORDER — CETIRIZINE HCL 10 MG PO TABS: 10.0000 mg | ORAL_TABLET | Freq: Every day | ORAL | 0 refills | Status: DC

## 2020-11-22 MED ORDER — PREDNISONE 20 MG PO TABS
20.0000 mg | ORAL_TABLET | Freq: Every day | ORAL | 0 refills | Status: AC
Start: 2020-11-22 — End: 2020-11-27

## 2020-11-22 MED ORDER — FLUTICASONE PROPIONATE 50 MCG/ACT NA SUSP: 2.0000 | Freq: Every day | NASAL | 2 refills | Status: DC

## 2020-11-22 NOTE — Discharge Instructions (Addendum)
You have bronchitis of an uncertain origin.  It is my opinion that this is inflammatory secondary to you having had a cold and cough for the past 2 weeks.  Please begin Flonase 1 spray twice daily in each nare to address the the production of nasal drainage.  I also recommend Zyrtec daily for this as well.  I have given you a 5-day prescription for prednisone to help reduce inflammation in your lungs.  Please take 1 daily in the morning with food.  If you are feeling better after 3 days, you can discontinue.  If not please finish full course.  Please continue to monitor yourself for fever, worsening cough, increased body ache, sore throat, chills.  If you experience any of the symptoms please return for further evaluation.

## 2020-11-22 NOTE — ED Provider Notes (Signed)
[facility]    CSN: [id number] Arrival date & time: 11/22/20  0932      History   Chief Complaint Chief Complaint  Patient presents with   Cough   Nasal Congestion    HPI Sharon Cortez is a 35 y.o. female.   Patient complains of a 2-week history of cough, states she is a mother of 4 children and is unable to recall how her symptoms began.  States that she noticed this morning that she was coughing up sputum that had a brown tinge to it.  Patient states otherwise a cough is nonproductive and worse at night, states she cannot lie down when trying to sleep because she has postnasal drip which makes her feel like she is going to choke, wakes up coughing unable to clear anything.  Patient states she is now having pain with her cough.  Patient states she does not have a history significant for frequent upper respiratory tract infections, denies a history of allergies, bronchitis, pneumonia, asthma.  Patient states she has tried Delsym, Mucinex, Zyrtec with no relief.  Patient states that 2 of her children have been sick recently and she feels that she may have caught something from them however they are feeling better and she is not.  Patient denies known fever, aches, chills, nausea, vomiting, diarrhea, headache, loss of taste and smell, ear pain, sore throat, shortness of breath.  The history is provided by the patient.   Past Medical History:  Diagnosis Date   Anemia    Heart murmur     There are no problems to display for this patient.   Past Surgical History:  Procedure Laterality Date   CESAREAN SECTION  2017   TUBAL LIGATION      OB History     Gravida  4   Para  4   Term      Preterm      AB      Living  4      SAB      IAB      Ectopic      Multiple      Live Births               Home Medications    Prior to Admission medications   Medication Sig Start Date End Date Taking? Authorizing Provider  cetirizine (ZYRTEC ALLERGY) 10 MG  tablet Take 1 tablet (10 mg total) by mouth at bedtime. 11/22/20 12/22/20 Yes Theadora Rama Scales, PA-C  fluticasone (FLONASE) 50 MCG/ACT nasal spray Place 2 sprays into both nostrils daily. 11/22/20 12/22/20 Yes Theadora Rama Scales, PA-C  predniSONE (DELTASONE) 20 MG tablet Take 1 tablet (20 mg total) by mouth daily with breakfast for 5 days. 11/22/20 11/27/20 Yes Theadora Rama Scales, PA-C    Family History Family History  Problem Relation Age of Onset   Stroke Mother    Diabetes Father    Hypertension Father    Hyperlipidemia Father    Seizures Paternal Aunt    Diabetes Paternal Grandmother     Social History Social History   Tobacco Use   Smoking status: Never   Smokeless tobacco: Never  Vaping Use   Vaping Use: Never used  Substance Use Topics   Alcohol use: Yes    Comment: 2 drinks/month   Drug use: No     Allergies   Patient has no known allergies.   Review of Systems Review of Systems Per HPI  Physical Exam Triage Vital Signs  ED Triage Vitals  Enc Vitals Group     BP 11/22/20 0943 (!) 130/59     Pulse Rate 11/22/20 0943 87     Resp 11/22/20 0943 18     Temp 11/22/20 0943 99 F (37.2 C)     Temp Source 11/22/20 0943 Oral     SpO2 11/22/20 0943 99 %     Weight --      Height --      Head Circumference --      Peak Flow --      Pain Score 11/22/20 0946 3     Pain Loc --      Pain Edu? --      Excl. in GC? --    No data found.  Updated Vital Signs BP (!) 130/59 (BP Location: Right Arm)   Pulse 87   Temp 99 F (37.2 C) (Oral)   Resp 18   LMP 10/31/2020 (Approximate)   SpO2 99%   Visual Acuity Right Eye Distance:   Left Eye Distance:   Bilateral Distance:    Right Eye Near:   Left Eye Near:    Bilateral Near:     Physical Exam Vitals and nursing note reviewed.  Constitutional:      Appearance: Normal appearance.  HENT:     Head: Normocephalic and atraumatic.     Right Ear: Ear canal and external ear normal. Tympanic membrane  is bulging (Clear fluid).     Left Ear: Ear canal and external ear normal. Tympanic membrane is bulging (Clear fluid).     Nose: Congestion and rhinorrhea present. Rhinorrhea is clear.     Right Turbinates: Enlarged, swollen and pale.     Left Turbinates: Enlarged, swollen and pale.     Mouth/Throat:     Mouth: Mucous membranes are moist.     Pharynx: Oropharynx is clear. Uvula midline. Posterior oropharyngeal erythema present. No pharyngeal swelling, oropharyngeal exudate or uvula swelling.     Tonsils: No tonsillar exudate or tonsillar abscesses. 1+ on the right. 1+ on the left.  Eyes:     Extraocular Movements: Extraocular movements intact.     Conjunctiva/sclera: Conjunctivae normal.     Pupils: Pupils are equal, round, and reactive to light.  Cardiovascular:     Rate and Rhythm: Normal rate and regular rhythm.     Pulses: Normal pulses.     Heart sounds: Normal heart sounds.  Pulmonary:     Effort: Pulmonary effort is normal.     Breath sounds: Normal breath sounds.  Musculoskeletal:        General: Normal range of motion.     Cervical back: Normal range of motion and neck supple.  Skin:    General: Skin is warm and dry.  Neurological:     General: No focal deficit present.     Mental Status: She is alert and oriented to person, place, and time. Mental status is at baseline.  Psychiatric:        Mood and Affect: Mood normal.        Behavior: Behavior normal.     UC Treatments / Results  Labs (all labs ordered are listed, but only abnormal results are displayed) Labs Reviewed - No data to display  EKG   Radiology No results found.  Procedures Procedures (including critical care time)  Medications Ordered in UC Medications - No data to display  Initial Impression / Assessment and Plan / UC Course  I have reviewed the triage vital  signs and the nursing notes.  Pertinent labs & imaging results that were available during my care of the patient were reviewed by me  and considered in my medical decision making (see chart for details).     Based on the history provided to me by patient and my physical exam findings, I feel patient is suffering from an allergic bronchitis.  While I do not appreciate any wheezing on exam, she clearly has a irritative cough that is nonproductive.  I recommend that she continue Zyrtec and advised her that it can take up to a week before she begins to have full benefit.  I also recommend that she add Flonase and take an oral steroid for 5 days with instructions to discontinue after 3 if she has resolution of her cough.  Patient was advised to monitor for fever, sinus tenderness, purulent drainage, sore throat, worsening body ache and to return for monitoring should she experience any of the symptoms.  Patient verbalized understanding and agreement with plan, all questions were addressed during visit today. Final Clinical Impressions(s) / UC Diagnoses   Final diagnoses:  Acute bronchitis, unspecified organism  Acute rhinitis  Cough     Discharge Instructions      You have bronchitis of an uncertain origin.  It is my opinion that this is inflammatory secondary to you having had a cold and cough for the past 2 weeks.  Please begin Flonase 1 spray twice daily in each nare to address the the production of nasal drainage.  I also recommend Zyrtec daily for this as well.  I have given you a 5-day prescription for prednisone to help reduce inflammation in your lungs.  Please take 1 daily in the morning with food.  If you are feeling better after 3 days, you can discontinue.  If not please finish full course.  Please continue to monitor yourself for fever, worsening cough, increased body ache, sore throat, chills.  If you experience any of the symptoms please return for further evaluation.     ED Prescriptions     Medication Sig Dispense Auth. Provider   cetirizine (ZYRTEC ALLERGY) 10 MG tablet Take 1 tablet (10 mg total) by  mouth at bedtime. 30 tablet Theadora Rama Scales, PA-C   predniSONE (DELTASONE) 20 MG tablet Take 1 tablet (20 mg total) by mouth daily with breakfast for 5 days. 5 tablet Theadora Rama Scales, PA-C   fluticasone (FLONASE) 50 MCG/ACT nasal spray Place 2 sprays into both nostrils daily. 18 mL Theadora Rama Scales, PA-C      PDMP not reviewed this encounter.   Theadora Rama Scales, PA-C 11/22/20 1016

## 2020-11-22 NOTE — ED Triage Notes (Signed)
Pt reports having a productive cough, and nasal congestion for about two weeks.  Home Interventions: Delsym, mucinex, none were effective

## 2020-12-25 ENCOUNTER — Telehealth: Payer: Self-pay | Admitting: Obstetrics and Gynecology

## 2020-12-25 NOTE — Telephone Encounter (Signed)
Patient switched practices.

## 2020-12-25 NOTE — Telephone Encounter (Signed)
-----   Message from [employer] sent at 11/25/2020  5:35 AM EDT ----- Regarding: Cancellation of Order # [id number] Order number [id number] for the procedure COLPOSCOPY [PRO47] has  been canceled by [employer] [[email or web address]]. This procedure was  ordered by Romualdo Bolk, MD [[id number]] on Nov 21, 2019 for the patient Sharon Cortez [[id number]]. The reason for  cancellation was "Order Expired".  This was a future order.

## 2021-02-11 ENCOUNTER — Ambulatory Visit
Admission: EM | Admit: 2021-02-11 | Discharge: 2021-02-11 | Disposition: A | Discharge status: Home / Self Care | Payer: BC Managed Care – PPO

## 2021-02-11 ENCOUNTER — Other Ambulatory Visit: Payer: Self-pay

## 2021-02-11 DIAGNOSIS — J4521 Mild intermittent asthma with (acute) exacerbation: Secondary | ICD-10-CM | POA: Diagnosis not present

## 2021-02-11 DIAGNOSIS — J069 Acute upper respiratory infection, unspecified: Secondary | ICD-10-CM | POA: Diagnosis not present

## 2021-02-11 DIAGNOSIS — J209 Acute bronchitis, unspecified: Secondary | ICD-10-CM | POA: Diagnosis not present

## 2021-02-11 MED ORDER — FLUTICASONE PROPIONATE 50 MCG/ACT NA SUSP: 2.0000 | Freq: Every day | NASAL | 2 refills | Status: AC

## 2021-02-11 MED ORDER — METHYLPREDNISOLONE 4 MG PO TBPK: ORAL_TABLET | ORAL | 0 refills | Status: AC

## 2021-02-11 MED ORDER — METHYLPREDNISOLONE SODIUM SUCC 125 MG IJ SOLR
125.0000 mg | Freq: Once | INTRAMUSCULAR | Status: AC
  Administered 2021-02-11: 125 mg via INTRAMUSCULAR

## 2021-02-11 MED ORDER — AZITHROMYCIN 250 MG PO TABS: 250.0000 mg | ORAL_TABLET | Freq: Every day | ORAL | 0 refills | Status: DC

## 2021-02-11 MED ORDER — ALBUTEROL SULFATE HFA 108 (90 BASE) MCG/ACT IN AERS: 2.0000 | INHALATION_SPRAY | Freq: Four times a day (QID) | RESPIRATORY_TRACT | 0 refills | Status: AC | PRN

## 2021-02-11 MED ORDER — CETIRIZINE HCL 10 MG PO TABS: 10.0000 mg | ORAL_TABLET | Freq: Every day | ORAL | 2 refills | Status: AC

## 2021-02-11 MED ORDER — MONTELUKAST SODIUM 10 MG PO TABS: 10.0000 mg | ORAL_TABLET | Freq: Every day | ORAL | 2 refills | Status: AC

## 2021-02-11 MED ORDER — AEROCHAMBER PLUS FLO-VU LARGE MISC: 1.0000 | Freq: Once | 0 refills | Status: AC

## 2021-02-11 MED ORDER — AMOXICILLIN-POT CLAVULANATE 875-125 MG PO TABS: 1.0000 | ORAL_TABLET | Freq: Two times a day (BID) | ORAL | 0 refills | Status: AC

## 2021-02-11 NOTE — Discharge Instructions (Addendum)
You received an injection of a powerful steroid called methylprednisolone which should provide you with some immediate relief of your cough and chest tightness.  To follow this up, please continue methylprednisolone orally, provided you with a Dosepak, please take 1 row of tablets daily with your breakfast meal until complete.  I also think would be a good idea given azithromycin which not only will address any the atypical bacteria which may be complicating what might just be a viral infection, azithromycin also has an anti-inflammatory property and should help calm your lung tissue as well.  Please resume Zyrtec and Flonase as previously prescribed, I have added a third allergy medication called Singulair which is very helpful in patients who suffer from asthma.  Whether your asthma is temporary related to something in your environment or your asthma is related to a viral infection that may perhaps never completely resolved or presence of bacteria in your lungs that is causing inflammation, Singulair will significantly reduce inflammation and keep your lungs, your trying to recover from this recent onslaught of bronchitis.  Finally, please begin albuterol inhaler, 2 puffs in the morning and 2 puffs in the evening and 2 puffs any other time of the day that you notice that you are having chest tightness, shortness of breath or irritative cough.  Please continue using this even after you feel better, at least twice daily until you are able to be seen by primary care and have had further evaluation for your new diagnosis of reactive airway disease.  I have initiated a request to help you find a primary care provider, I have stated that the reason for referral is recurrent bronchitis with 2 episodes in less than 3 months and that you need further evaluation, possible pulmonary function testing.  Thank you for visiting urgent care today, I appreciate your confidence in my ability to help you feel better.   Please feel free to come back Friday or Monday if I have not been successful in resolving your symptoms at this time.

## 2021-02-11 NOTE — ED Provider Notes (Addendum)
[facility]    CSN: [id number] Arrival date & time: 02/11/21  1453    HISTORY  No chief complaint on file.  HPI Sharon Cortez is a 35 y.o. female. Patient was seen here at urgent care in September 2022 and diagnosed with acute bronchitis and bronchospasm, treated with steroids.  Patient states after she finished treatment she felt completely better.  Patient states about a week after that, she began to have upper respiratory symptoms again including cough, congestion, nasal drainage.  Patient states the cough is continued to get worse and worse, patient states she is back today to find out what could possibly be causing her to have the same symptoms all over again.  Patient denies fever, aches, chills, nausea, vomiting, diarrhea.  Patient denies known sick contacts.  Patient denies history of smoking or use of vapes.  Patient denies toxic exposure to environmental contaminants.  Patient denies medical history that is significant for sarcoid or other forms of idiopathic lung disease, autoimmune disease.  Patient states until September, she has never had an issue with this, denies a history of allergies or asthma when she was younger.   Past Medical History:  Diagnosis Date   Anemia    Heart murmur    There are no problems to display for this patient.  Past Surgical History:  Procedure Laterality Date   CESAREAN SECTION  2017   TUBAL LIGATION     OB History     Gravida  4   Para  4   Term      Preterm      AB      Living  4      SAB      IAB      Ectopic      Multiple      Live Births             Home Medications    Prior to Admission medications   Medication Sig Start Date End Date Taking? Authorizing Provider  azithromycin (ZITHROMAX) 250 MG tablet Take 1 tablet (250 mg total) by mouth daily. Take first 2 tablets together, then 1 every day until finished. 02/11/21  Yes Lynden Oxford Scales, PA-C  ibuprofen (ADVIL) 200 MG tablet Take 200 mg  by mouth every 6 (six) hours as needed.   Yes [provider]  methylPREDNISolone (MEDROL DOSEPAK) 4 MG TBPK tablet Take 24 mg on day 1, 20 mg on day 2, 16 mg on day 3, 12 mg on day 4, 8 mg on day 5, 4 mg on day 6. 02/11/21  Yes Lynden Oxford Scales, PA-C  montelukast (SINGULAIR) 10 MG tablet Take 1 tablet (10 mg total) by mouth at bedtime. 02/11/21 05/12/21 Yes Lynden Oxford Scales, PA-C  cetirizine (ZYRTEC ALLERGY) 10 MG tablet Take 1 tablet (10 mg total) by mouth at bedtime. 02/11/21 05/12/21  Lynden Oxford Scales, PA-C  fluticasone (FLONASE) 50 MCG/ACT nasal spray Place 2 sprays into both nostrils daily. 02/11/21 05/12/21  Lynden Oxford Scales, PA-C   Family History Family History  Problem Relation Age of Onset   Stroke Mother    Diabetes Father    Hypertension Father    Hyperlipidemia Father    Seizures Paternal Aunt    Diabetes Paternal Grandmother    Social History Social History   Tobacco Use   Smoking status: Never   Smokeless tobacco: Never  Vaping Use   Vaping Use: Never used  Substance Use Topics   Alcohol use: Yes  Comment: 2 drinks/month   Drug use: No   Allergies   Patient has no known allergies.  Review of Systems Review of Systems Pertinent findings noted in history of present illness.   Physical Exam Triage Vital Signs ED Triage Vitals  Enc Vitals Group     BP 12/27/20 0827 (!) 147/82     Pulse Rate 12/27/20 0827 72     Resp 12/27/20 0827 18     Temp 12/27/20 0827 98.3 F (36.8 C)     Temp Source 12/27/20 0827 Oral     SpO2 12/27/20 0827 98 %     Weight --      Height --      Head Circumference --      Peak Flow --      Pain Score 12/27/20 0826 5     Pain Loc --      Pain Edu? --      Excl. in [place]? --   No data found.  Updated Vital Signs BP 123/86 (BP Location: Right Arm)    Pulse 96    Temp 99.7 F (37.6 C) (Oral)    Resp 16    LMP 01/23/2021    SpO2 98%   Physical Exam Vitals and nursing note reviewed.  Constitutional:       General: She is not in acute distress.    Appearance: She is ill-appearing.  HENT:     Head: Normocephalic and atraumatic.     Salivary Glands: Right salivary gland is not diffusely enlarged or tender. Left salivary gland is not diffusely enlarged or tender.     Right Ear: Tympanic membrane, ear canal and external ear normal. No drainage. No middle ear effusion. There is no impacted cerumen. Tympanic membrane is not erythematous or bulging.     Left Ear: Tympanic membrane, ear canal and external ear normal. No drainage.  No middle ear effusion. There is no impacted cerumen. Tympanic membrane is not erythematous or bulging.     Nose: Nose normal. No nasal deformity, septal deviation, mucosal edema, congestion or rhinorrhea.     Right Turbinates: Not enlarged, swollen or pale.     Left Turbinates: Not enlarged, swollen or pale.     Right Sinus: No maxillary sinus tenderness or frontal sinus tenderness.     Left Sinus: No maxillary sinus tenderness or frontal sinus tenderness.     Mouth/Throat:     Lips: Pink. No lesions.     Mouth: Mucous membranes are moist. No oral lesions.     Pharynx: Oropharynx is clear. Uvula midline. No posterior oropharyngeal erythema or uvula swelling.     Tonsils: No tonsillar exudate. 0 on the right. 0 on the left.  Eyes:     General: Lids are normal.        Right eye: No discharge.        Left eye: No discharge.     Extraocular Movements: Extraocular movements intact.     Conjunctiva/sclera: Conjunctivae normal.     Right eye: Right conjunctiva is not injected.     Left eye: Left conjunctiva is not injected.  Neck:     Trachea: Trachea and phonation normal.  Cardiovascular:     Rate and Rhythm: Normal rate and regular rhythm.     Pulses: Normal pulses.     Heart sounds: Normal heart sounds. No murmur heard.   No friction rub. No gallop.  Pulmonary:     Effort: Accessory muscle usage and prolonged expiration present. No  respiratory distress.      Breath sounds: No stridor, decreased air movement or transmitted upper airway sounds. Examination of the right-upper field reveals wheezing and rales. Examination of the left-upper field reveals wheezing and rales. Examination of the right-middle field reveals wheezing and rales. Examination of the left-middle field reveals wheezing and rales. Examination of the right-lower field reveals wheezing and rhonchi. Examination of the left-lower field reveals wheezing and rhonchi. Wheezing, rhonchi and rales present. No decreased breath sounds.  Chest:     Chest wall: No tenderness.  Musculoskeletal:        General: Normal range of motion.     Cervical back: Normal range of motion and neck supple. Normal range of motion.  Lymphadenopathy:     Cervical: Cervical adenopathy present.     Right cervical: Deep cervical adenopathy and posterior cervical adenopathy present.     Left cervical: Deep cervical adenopathy and posterior cervical adenopathy present.  Skin:    General: Skin is warm and dry.     Findings: No erythema or rash.  Neurological:     General: No focal deficit present.     Mental Status: She is alert and oriented to person, place, and time.  Psychiatric:        Mood and Affect: Mood normal.        Behavior: Behavior normal.    Visual Acuity Right Eye Distance:   Left Eye Distance:   Bilateral Distance:    Right Eye Near:   Left Eye Near:    Bilateral Near:     UC Couse / Diagnostics / Procedures:    EKG  Radiology No results found.  Procedures Procedures (including critical care time)  UC Diagnoses / Final Clinical Impressions(s)   I have reviewed the triage vital signs and the nursing notes.  Pertinent labs & imaging results that were available during my care of the patient were reviewed by me and considered in my medical decision making (see chart for details).   Final diagnoses:  Acute upper respiratory infection  Acute bronchitis, unspecified organism   Bronchospasm with bronchitis, acute  Mild intermittent reactive airway disease with acute exacerbation   We will treat patient with steroids again, this time we will provide her with a steroid injection and a Medrol Dosepak.  Have also recommended that she begin azithromycin not only as an antibiotic but also for his anti-inflammatory properties.  Patient advised to resume Zyrtec and Flonase, have added Singulair.  Also advised patient that I recommend she begin using albuterol 2 puffs twice daily and not as often as needed for cough, shortness of breath and wheezing.  ED Prescriptions     Medication Sig Dispense Auth. Provider   cetirizine (ZYRTEC ALLERGY) 10 MG tablet Take 1 tablet (10 mg total) by mouth at bedtime. 30 tablet Lynden Oxford Scales, PA-C   fluticasone (FLONASE) 50 MCG/ACT nasal spray Place 2 sprays into both nostrils daily. 18 mL Lynden Oxford Scales, PA-C   montelukast (SINGULAIR) 10 MG tablet Take 1 tablet (10 mg total) by mouth at bedtime. 30 tablet Lynden Oxford Scales, PA-C   methylPREDNISolone (MEDROL DOSEPAK) 4 MG TBPK tablet Take 24 mg on day 1, 20 mg on day 2, 16 mg on day 3, 12 mg on day 4, 8 mg on day 5, 4 mg on day 6. 21 tablet Lynden Oxford Scales, PA-C   azithromycin (ZITHROMAX) 250 MG tablet Take 1 tablet (250 mg total) by mouth daily. Take first 2 tablets together, then 1  every day until finished. 6 tablet Lynden Oxford Scales, PA-C   albuterol (VENTOLIN HFA) 108 (90 Base) MCG/ACT inhaler Inhale 2 puffs into the lungs every 6 (six) hours as needed for wheezing or shortness of breath (Cough). 18 g Lynden Oxford Scales, PA-C   Spacer/Aero-Holding Chambers (AEROCHAMBER PLUS FLO-VU LARGE) MISC 1 each by Other route once for 1 dose. 1 each Lynden Oxford Scales, PA-C      PDMP not reviewed this encounter.  Pending results:  Labs Reviewed - No data to display  Medications Ordered in UC: Medications  methylPREDNISolone sodium succinate (SOLU-MEDROL)  125 mg/2 mL injection 125 mg (has no administration in time range)    Disposition Upon Discharge:  Condition: stable for discharge home Home: take medications as prescribed; routine discharge instructions as discussed; follow up as advised.  Patient presented with an acute illness with associated systemic symptoms and significant discomfort requiring urgent management. In my opinion, this is a condition that a prudent lay person (someone who possesses an average knowledge of health and medicine) may potentially expect to result in complications if not addressed urgently such as respiratory distress, impairment of bodily function or dysfunction of bodily organs.   Routine symptom specific, illness specific and/or disease specific instructions were discussed with the patient and/or caregiver at length.   As such, the patient has been evaluated and assessed, work-up was performed and treatment was provided in alignment with urgent care protocols and evidence based medicine.  Patient/parent/caregiver has been advised that the patient may require follow up for further testing and treatment if the symptoms continue in spite of treatment, as clinically indicated and appropriate.  The patient was tested for COVID-19, Influenza and/or RSV, then the patient/parent/guardian was advised to isolate at home pending the results of his/her diagnostic coronavirus test and potentially longer if theyre positive. I have also advised pt that if his/her COVID-19 test returns positive, it's recommended to self-isolate for at least 10 days after symptoms first appeared AND until fever-free for 24 hours without fever reducer AND other symptoms have improved or resolved. Discussed self-isolation recommendations as well as instructions for household member/close contacts as per the [facility] and  DHHS, and also gave patient the [place] packet with this information.  Patient/parent/caregiver has been advised to return to the [facility] or  PCP in 3-5 days if no better; to PCP or the Emergency Department if new signs and symptoms develop, or if the current signs or symptoms continue to change or worsen for further workup, evaluation and treatment as clinically indicated and appropriate  The patient will follow up with their current PCP if and as advised. If the patient does not currently have a PCP we will assist them in obtaining one.   The patient may need specialty follow up if the symptoms continue, in spite of conservative treatment and management, for further workup, evaluation, consultation and treatment as clinically indicated and appropriate.  Patient/parent/caregiver verbalized understanding and agreement of plan as discussed.  All questions were addressed during visit.  Please see discharge instructions below for further details of plan.  Discharge Instructions:   Discharge Instructions      You received an injection of a powerful steroid called methylprednisolone which should provide you with some immediate relief of your cough and chest tightness.  To follow this up, please continue methylprednisolone orally, provided you with a Dosepak, please take 1 row of tablets daily with your breakfast meal until complete.  I also think would be a good idea given  azithromycin which not only will address any the atypical bacteria which may be complicating what might just be a viral infection, azithromycin also has an anti-inflammatory property and should help calm your lung tissue as well.  Please resume Zyrtec and Flonase as previously prescribed, I have added a third allergy medication called Singulair which is very helpful in patients who suffer from asthma.  Whether your asthma is temporary related to something in your environment or your asthma is related to a viral infection that may perhaps never completely resolved or presence of bacteria in your lungs that is causing inflammation, Singulair will significantly reduce  inflammation and keep your lungs, your trying to recover from this recent onslaught of bronchitis.  Finally, please begin albuterol inhaler, 2 puffs in the morning and 2 puffs in the evening and 2 puffs any other time of the day that you notice that you are having chest tightness, shortness of breath or irritative cough.  Please continue using this even after you feel better, at least twice daily until you are able to be seen by primary care and have had further evaluation for your new diagnosis of reactive airway disease.  I have initiated a request to help you find a primary care provider, I have stated that the reason for referral is recurrent bronchitis with 2 episodes in less than 3 months and that you need further evaluation, possible pulmonary function testing.  Thank you for visiting urgent care today, I appreciate your confidence in my ability to help you feel better.  Please feel free to come back Friday or Monday if I have not been successful in resolving your symptoms at this time.    Addendum: Patient refused azithromycin, states it causes her to have diarrhea nausea and vomiting.  Antibiotics switched to Augmentin, patient advised that this would not have the same beneficial anti-inflammatory effect that azithromycin does.  5-day course of Augmentin sent to pharmacy, pharmacy advised.   Theadora Rama Scales, PA-C 02/11/21 1622    Theadora Rama Scales, PA-C 02/11/21 1635

## 2021-02-11 NOTE — ED Triage Notes (Addendum)
Pt states she has a cough, congestion and has soreness to her chest with the cough.

## 2021-02-13 ENCOUNTER — Encounter (HOSPITAL_COMMUNITY): Payer: Self-pay

## 2021-11-13 ENCOUNTER — Encounter: Payer: Self-pay | Admitting: General Practice

## 2022-01-12 ENCOUNTER — Encounter: Payer: BC Managed Care – PPO | Admitting: Family Medicine

## 2022-08-01 ENCOUNTER — Emergency Department (HOSPITAL_BASED_OUTPATIENT_CLINIC_OR_DEPARTMENT_OTHER): Payer: BC Managed Care – PPO | Admitting: Radiology

## 2022-08-01 ENCOUNTER — Other Ambulatory Visit: Payer: Self-pay

## 2022-08-01 ENCOUNTER — Emergency Department (HOSPITAL_BASED_OUTPATIENT_CLINIC_OR_DEPARTMENT_OTHER)
Admission: EM | Admit: 2022-08-01 | Discharge: 2022-08-01 | Disposition: A | Discharge status: Home / Self Care | Payer: BC Managed Care – PPO | Attending: Emergency Medicine | Admitting: Emergency Medicine

## 2022-08-01 ENCOUNTER — Encounter (HOSPITAL_BASED_OUTPATIENT_CLINIC_OR_DEPARTMENT_OTHER): Payer: Self-pay | Admitting: Emergency Medicine

## 2022-08-01 DIAGNOSIS — M25511 Pain in right shoulder: Secondary | ICD-10-CM

## 2022-08-01 DIAGNOSIS — W010XXA Fall on same level from slipping, tripping and stumbling without subsequent striking against object, initial encounter: Secondary | ICD-10-CM | POA: Insufficient documentation

## 2022-08-01 DIAGNOSIS — S4991XA Unspecified injury of right shoulder and upper arm, initial encounter: Secondary | ICD-10-CM | POA: Diagnosis present

## 2022-08-01 DIAGNOSIS — S60512A Abrasion of left hand, initial encounter: Secondary | ICD-10-CM | POA: Diagnosis not present

## 2022-08-01 DIAGNOSIS — Y9302 Activity, running: Secondary | ICD-10-CM | POA: Diagnosis not present

## 2022-08-01 DIAGNOSIS — S80212A Abrasion, left knee, initial encounter: Secondary | ICD-10-CM | POA: Diagnosis not present

## 2022-08-01 DIAGNOSIS — S60511A Abrasion of right hand, initial encounter: Secondary | ICD-10-CM | POA: Diagnosis not present

## 2022-08-01 DIAGNOSIS — S40211A Abrasion of right shoulder, initial encounter: Secondary | ICD-10-CM | POA: Insufficient documentation

## 2022-08-01 DIAGNOSIS — S80211A Abrasion, right knee, initial encounter: Secondary | ICD-10-CM | POA: Insufficient documentation

## 2022-08-01 NOTE — ED Notes (Signed)
Pt resting; took OTC pain medication prior to arrival; call light in reach.

## 2022-08-01 NOTE — ED Provider Notes (Signed)
[place] EMERGENCY DEPARTMENT AT [facility] Provider Note   CSN: [id number] Arrival date & time: 08/01/22  1717     History {Add pertinent medical, surgical, social history, OB history to HPI:1} Chief Complaint  Patient presents with   Shoulder Injury    Sharon Cortez is a 37 y.o. female.   Shoulder Injury  Patient is a 37 year old female with no pertinent past medical history presents emergency room today after injury while running  She states that 3 days ago she was running and fell forward and fell onto her right shoulder.  She has had some pain and swelling in her shoulder and feels like it hurts when she tries to reach behind herself.  No head injuries or loss of consciousness no nausea or vomiting.  She has some scrapes on her knees and bilateral hands but states that these are not bothering her tremendously.  She is primarily concerned about her shoulder.     Home Medications Prior to Admission medications   Medication Sig Start Date End Date Taking? Authorizing Provider  albuterol (VENTOLIN HFA) 108 (90 Base) MCG/ACT inhaler Inhale 2 puffs into the lungs every 6 (six) hours as needed for wheezing or shortness of breath (Cough). 02/11/21   Theadora Rama Scales, PA-C  cetirizine (ZYRTEC ALLERGY) 10 MG tablet Take 1 tablet (10 mg total) by mouth at bedtime. 02/11/21 05/12/21  Theadora Rama Scales, PA-C  fluticasone (FLONASE) 50 MCG/ACT nasal spray Place 2 sprays into both nostrils daily. 02/11/21 05/12/21  Theadora Rama Scales, PA-C  ibuprofen (ADVIL) 200 MG tablet Take 200 mg by mouth every 6 (six) hours as needed.    [provider]  methylPREDNISolone (MEDROL DOSEPAK) 4 MG TBPK tablet Take 24 mg on day 1, 20 mg on day 2, 16 mg on day 3, 12 mg on day 4, 8 mg on day 5, 4 mg on day 6. 02/11/21   Theadora Rama Scales, PA-C  montelukast (SINGULAIR) 10 MG tablet Take 1 tablet (10 mg total) by mouth at bedtime. 02/11/21 05/12/21  Theadora Rama Scales,  PA-C      Allergies    Patient has no known allergies.    Review of Systems   Review of Systems  Physical Exam Updated Vital Signs BP 120/86   Pulse 82   Temp 98.1 F (36.7 C) (Oral)   Resp 18   SpO2 100%  Physical Exam  ED Results / Procedures / Treatments   Labs (all labs ordered are listed, but only abnormal results are displayed) Labs Reviewed - No data to display  EKG None  Radiology DG Shoulder Right  Result Date: 08/01/2022 CLINICAL DATA:  Injury EXAM: RIGHT SHOULDER - 2+ VIEW COMPARISON:  None Available. FINDINGS: There is no evidence of fracture or dislocation. There is no evidence of arthropathy or other focal bone abnormality. There is soft tissue swelling of the superior shoulder. IMPRESSION: 1. No acute fracture or dislocation. 2. Soft tissue swelling of the superior shoulder. Electronically Signed   By: Darliss Cheney M.D.   On: 08/01/2022 17:54    Procedures Procedures  {Document cardiac monitor, telemetry assessment procedure when appropriate:1}  Medications Ordered in ED Medications - No data to display  ED Course/ Medical Decision Making/ A&P   {   Click here for ABCD2, HEART and other calculatorsREFRESH Note before signing :1}                          Medical Decision Making  Amount and/or Complexity of Data Reviewed Radiology: ordered.   ***  {Document critical care time when appropriate:1} {Document review of labs and clinical decision tools ie heart score, Chads2Vasc2 etc:1}  {Document your independent review of radiology images, and any outside records:1} {Document your discussion with family members, caretakers, and with consultants:1} {Document social determinants of health affecting pt's care:1} {Document your decision making why or why not admission, treatments were needed:1} Final Clinical Impression(s) / ED Diagnoses Final diagnoses:  None    Rx / DC Orders ED Discharge Orders     None

## 2022-08-01 NOTE — Discharge Instructions (Addendum)
You did not have any fractures or dislocations of your arm.  I do suspect that you have at least torn a muscle and this will require some time to heal.  I recommend range of motion exercises as discussed, Tylenol and ibuprofen as discussed below for pain.  Please use Tylenol or ibuprofen for pain.  You may use 600 mg ibuprofen every 6 hours or 1000 mg of Tylenol every 6 hours.  You may choose to alternate between the 2.  This would be most effective.  Not to exceed 4 g of Tylenol within 24 hours.  Not to exceed 3200 mg ibuprofen 24 hours.   Please follow-up with orthopedics in 1 to 2 weeks if you are still having issues with your shoulder

## 2022-08-01 NOTE — ED Triage Notes (Signed)
Fall while running.  Pain in right shoulder. "It feels off track" Happened  3 days ago.

## 2023-04-28 ENCOUNTER — Emergency Department (HOSPITAL_BASED_OUTPATIENT_CLINIC_OR_DEPARTMENT_OTHER): Payer: BC Managed Care – PPO

## 2023-04-28 ENCOUNTER — Emergency Department (HOSPITAL_BASED_OUTPATIENT_CLINIC_OR_DEPARTMENT_OTHER)
Admission: EM | Admit: 2023-04-28 | Discharge: 2023-04-28 | Disposition: A | Discharge status: Home / Self Care | Payer: BC Managed Care – PPO | Attending: Emergency Medicine | Admitting: Emergency Medicine

## 2023-04-28 ENCOUNTER — Other Ambulatory Visit: Payer: Self-pay

## 2023-04-28 DIAGNOSIS — R110 Nausea: Secondary | ICD-10-CM | POA: Insufficient documentation

## 2023-04-28 DIAGNOSIS — N151 Renal and perinephric abscess: Secondary | ICD-10-CM | POA: Diagnosis not present

## 2023-04-28 DIAGNOSIS — R109 Unspecified abdominal pain: Secondary | ICD-10-CM | POA: Diagnosis present

## 2023-04-28 LAB — COMPREHENSIVE METABOLIC PANEL
ALT: 9 U/L (ref 0–44)
AST: 13 U/L — ABNORMAL LOW (ref 15–41)
Albumin: 5 g/dL (ref 3.5–5.0)
Alkaline Phosphatase: 27 U/L — ABNORMAL LOW (ref 38–126)
Anion gap: 9 (ref 5–15)
BUN: 14 mg/dL (ref 6–20)
CO2: 27 mmol/L (ref 22–32)
Calcium: 10.1 mg/dL (ref 8.9–10.3)
Chloride: 103 mmol/L (ref 98–111)
Creatinine, Ser: 1.03 mg/dL — ABNORMAL HIGH (ref 0.44–1.00)
GFR, Estimated: >60 mL/min (ref >60)
Glucose, Bld: 115 mg/dL — ABNORMAL HIGH (ref 70–99)
Potassium: 3.6 mmol/L (ref 3.5–5.1)
Sodium: 139 mmol/L (ref 135–145)
Total Bilirubin: 0.6 mg/dL (ref 0.0–1.2)
Total Protein: 7.6 g/dL (ref 6.5–8.1)

## 2023-04-28 LAB — CBC WITH DIFFERENTIAL/PLATELET
Abs Immature Granulocytes: 0.01 K/uL (ref 0.00–0.07)
Basophils Absolute: 0 K/uL (ref 0.0–0.1)
Basophils Relative: 0 %
Eosinophils Absolute: 0 K/uL (ref 0.0–0.5)
Eosinophils Relative: 0 %
HCT: 36.6 % (ref 36.0–46.0)
Hemoglobin: 12 g/dL (ref 12.0–15.0)
Immature Granulocytes: 0 %
Lymphocytes Relative: 4 %
Lymphs Abs: 0.4 K/uL — ABNORMAL LOW (ref 0.7–4.0)
MCH: 26.9 pg (ref 26.0–34.0)
MCHC: 32.8 g/dL (ref 30.0–36.0)
MCV: 82.1 fL (ref 80.0–100.0)
Monocytes Absolute: 0.7 K/uL (ref 0.1–1.0)
Monocytes Relative: 7 %
Neutro Abs: 8.8 K/uL — ABNORMAL HIGH (ref 1.7–7.7)
Neutrophils Relative %: 89 %
Platelets: 306 K/uL (ref 150–400)
RBC: 4.46 MIL/uL (ref 3.87–5.11)
RDW: 13.5 % (ref 11.5–15.5)
WBC: 10 K/uL (ref 4.0–10.5)
nRBC: 0 % (ref 0.0–0.2)

## 2023-04-28 LAB — URINALYSIS, W/ REFLEX TO CULTURE (INFECTION SUSPECTED)
Bacteria, UA: NONE SEEN
Bilirubin Urine: NEGATIVE
Glucose, UA: NEGATIVE mg/dL
Ketones, ur: NEGATIVE mg/dL
Nitrite: NEGATIVE
Protein, ur: 100 mg/dL — AB
RBC / HPF: >50 RBC/hpf (ref 0–5)
Specific Gravity, Urine: 1.011 (ref 1.005–1.030)
WBC, UA: >50 WBC/hpf (ref 0–5)
pH: 6 (ref 5.0–8.0)

## 2023-04-28 LAB — HCG, QUANTITATIVE, PREGNANCY: hCG, Beta Chain, Quant, S: <1 m[IU]/mL (ref <5)

## 2023-04-28 LAB — LIPASE, BLOOD: Lipase: 19 U/L (ref 11–51)

## 2023-04-28 MED ORDER — ONDANSETRON 4 MG PO TBDP: 4.0000 mg | ORAL_TABLET | Freq: Three times a day (TID) | ORAL | 0 refills | Status: AC | PRN

## 2023-04-28 MED ORDER — OXYCODONE-ACETAMINOPHEN 5-325 MG PO TABS: 1.0000 | ORAL_TABLET | Freq: Four times a day (QID) | ORAL | 0 refills | Status: AC | PRN

## 2023-04-28 MED ORDER — SODIUM CHLORIDE 0.9 % IV SOLN
1.0000 g | Freq: Once | INTRAVENOUS | Status: DC
  Filled 2023-04-28: qty 10

## 2023-04-28 MED ORDER — KETOROLAC TROMETHAMINE 15 MG/ML IJ SOLN
15.0000 mg | Freq: Once | INTRAMUSCULAR | Status: AC
  Administered 2023-04-28: 15 mg via INTRAVENOUS
  Filled 2023-04-28: qty 1

## 2023-04-28 MED ORDER — IOHEXOL 300 MG/ML  SOLN
100.0000 mL | Freq: Once | INTRAMUSCULAR | Status: AC | PRN
  Administered 2023-04-28: 80 mL via INTRAVENOUS

## 2023-04-28 MED ORDER — LEVOFLOXACIN IN D5W 750 MG/150ML IV SOLN
750.0000 mg | Freq: Once | INTRAVENOUS | Status: AC
  Administered 2023-04-28: 750 mg via INTRAVENOUS
  Filled 2023-04-28: qty 150

## 2023-04-28 MED ORDER — LEVOFLOXACIN 500 MG PO TABS
500.0000 mg | ORAL_TABLET | Freq: Every day | ORAL | 0 refills | Status: AC
Start: 2023-04-28 — End: 2023-06-09

## 2023-04-28 NOTE — Discharge Instructions (Addendum)
 You were seen in the emergency department today for concerns of flank pain.  Your labs and imaging do have concerns for infection in your urinary tract system with signs of an abscess in your right kidney.  I spoke with our urologist on-call who advised starting you on a group of antibiotics called fluoroquinolones.  You are given an initial dose of levofloxacin here in the emergency department with a prescription for this medication sent to your pharmacy to take for the next 6 weeks.  I would reach out to your primary care provider for assessment during this time.  He will also be following up with the urologist for further management and evaluation and likely repeat imaging.  If any concerns for worsening symptoms such as fevers, severe pain, or severe nausea and vomiting with inability to tolerate any food intake, return to the emergency department.

## 2023-04-28 NOTE — ED Provider Triage Note (Signed)
 Emergency Medicine Provider Triage Evaluation Note  Sharon Cortez , a 38 y.o. female  was evaluated in triage.  Pt complains of left flank pain.  Review of Systems  Positive:  Negative:   Physical Exam  There were no vitals taken for this visit. Gen:   Awake, no distress   Resp:  Normal effort  MSK:   Moves extremities without difficulty  Other:    Medical Decision Making  Medically screening exam initiated at 2:43 PM.  Appropriate orders placed.  Sharon Cortez was informed that the remainder of the evaluation will be completed by another provider, this initial triage assessment does not replace that evaluation, and the importance of remaining in the ED until their evaluation is complete.  Left flank pain starting yesterday. Tubal ligation reversal surgery in January. Also had UTI in January.   Dorthy Cooler, [place] 04/28/23 1444

## 2023-04-28 NOTE — ED Provider Notes (Signed)
 [place] EMERGENCY DEPARTMENT AT [facility] Provider Note   CSN: [id number] Arrival date & time: 04/28/23  1422     History Chief Complaint  Patient presents with   Flank Pain    Sharon Cortez is a 38 y.o. female.  Patient presents to the emergency department with concerns of flank pain.  Reports that she began to experience notable right-sided flank pain starting yesterday.  History of prior bladder infections and had tubal ligation reversal in January.  States some darker urine but denies any obvious blood.  Also endorses some nausea but no active vomiting.  No recent fever chills or bodyaches.  No prior history of any renal abnormalities.   Flank Pain       Home Medications Prior to Admission medications   Medication Sig Start Date End Date Taking? Authorizing Provider  levofloxacin (LEVAQUIN) 500 MG tablet Take 1 tablet (500 mg total) by mouth daily. 04/28/23 06/09/23 Yes Smitty Knudsen, PA-C  ondansetron (ZOFRAN-ODT) 4 MG disintegrating tablet Take 1 tablet (4 mg total) by mouth every 8 (eight) hours as needed for nausea or vomiting. 04/28/23  Yes Smitty Knudsen, PA-C  oxyCODONE-acetaminophen (PERCOCET/ROXICET) 5-325 MG tablet Take 1 tablet by mouth every 6 (six) hours as needed for severe pain (pain score 7-10). 04/28/23  Yes Chandlor Noecker A, PA-C  albuterol (VENTOLIN HFA) 108 (90 Base) MCG/ACT inhaler Inhale 2 puffs into the lungs every 6 (six) hours as needed for wheezing or shortness of breath (Cough). 02/11/21   Theadora Rama Scales, PA-C  cetirizine (ZYRTEC ALLERGY) 10 MG tablet Take 1 tablet (10 mg total) by mouth at bedtime. 02/11/21 05/12/21  Theadora Rama Scales, PA-C  fluticasone (FLONASE) 50 MCG/ACT nasal spray Place 2 sprays into both nostrils daily. 02/11/21 05/12/21  Theadora Rama Scales, PA-C  ibuprofen (ADVIL) 200 MG tablet Take 200 mg by mouth every 6 (six) hours as needed.    [provider]  methylPREDNISolone (MEDROL DOSEPAK) 4 MG TBPK  tablet Take 24 mg on day 1, 20 mg on day 2, 16 mg on day 3, 12 mg on day 4, 8 mg on day 5, 4 mg on day 6. 02/11/21   Theadora Rama Scales, PA-C  montelukast (SINGULAIR) 10 MG tablet Take 1 tablet (10 mg total) by mouth at bedtime. 02/11/21 05/12/21  Theadora Rama Scales, PA-C      Allergies    Patient has no known allergies.    Review of Systems   Review of Systems  Genitourinary:  Positive for flank pain.  All other systems reviewed and are negative.   Physical Exam Updated Vital Signs BP 117/73   Pulse 86   Temp 98.7 F (37.1 C) (Oral)   Resp 17   SpO2 100%  Physical Exam Vitals and nursing note reviewed.  Constitutional:      General: She is not in acute distress.    Appearance: She is well-developed.  HENT:     Head: Normocephalic and atraumatic.  Eyes:     Conjunctiva/sclera: Conjunctivae normal.  Cardiovascular:     Rate and Rhythm: Normal rate and regular rhythm.     Heart sounds: No murmur heard. Pulmonary:     Effort: Pulmonary effort is normal. No respiratory distress.     Breath sounds: Normal breath sounds.  Abdominal:     Palpations: Abdomen is soft.     Tenderness: There is no abdominal tenderness. There is right CVA tenderness. There is no left CVA tenderness.  Musculoskeletal:  General: No swelling.     Cervical back: Neck supple.  Skin:    General: Skin is warm and dry.     Capillary Refill: Capillary refill takes less than 2 seconds.  Neurological:     Mental Status: She is alert.  Psychiatric:        Mood and Affect: Mood normal.     ED Results / Procedures / Treatments   Labs (all labs ordered are listed, but only abnormal results are displayed) Labs Reviewed  COMPREHENSIVE METABOLIC PANEL - Abnormal; Notable for the following components:      Result Value   Glucose, Bld 115 (*)    Creatinine, Ser 1.03 (*)    AST 13 (*)    Alkaline Phosphatase 27 (*)    All other components within normal limits  CBC WITH DIFFERENTIAL/PLATELET  - Abnormal; Notable for the following components:   Neutro Abs 8.8 (*)    Lymphs Abs 0.4 (*)    All other components within normal limits  URINALYSIS, W/ REFLEX TO CULTURE (INFECTION SUSPECTED) - Abnormal; Notable for the following components:   Color, Urine BROWN (*)    APPearance CLOUDY (*)    Hgb urine dipstick LARGE (*)    Protein, ur 100 (*)    Leukocytes,Ua MODERATE (*)    All other components within normal limits  URINE CULTURE  LIPASE, BLOOD  HCG, QUANTITATIVE, PREGNANCY    EKG None  Radiology CT ABDOMEN PELVIS W CONTRAST Result Date: 04/28/2023 CLINICAL DATA:  Left flank pain, abdominal pain EXAM: CT ABDOMEN AND PELVIS WITH CONTRAST TECHNIQUE: Multidetector CT imaging of the abdomen and pelvis was performed using the standard protocol following bolus administration of intravenous contrast. RADIATION DOSE REDUCTION: This exam was performed according to the departmental dose-optimization program which includes automated exposure control, adjustment of the mA and/or kV according to patient size and/or use of iterative reconstruction technique. CONTRAST:  80mL OMNIPAQUE IOHEXOL 300 MG/ML  SOLN COMPARISON:  None Available. FINDINGS: Lower chest: No acute abnormality Hepatobiliary: No focal hepatic abnormality. Gallbladder unremarkable. Pancreas: No focal abnormality or ductal dilatation. Spleen: No focal abnormality.  Normal size. Adrenals/Urinary Tract: Adrenal glands normal. Cystic area in the right upper pole measures up to 1.9 cm. This is surrounded by area of decreased perfusion. Findings suspicious for area of infection/pyelonephritis with resulting abscess. This extends into the subcapsular space. Where a subcapsular fluid collection measures approximately 2.5 x 1.2 cm. Left kidney is unremarkable. No hydronephrosis. Urinary bladder unremarkable. Stomach/Bowel: Stomach, large and small bowel grossly unremarkable. Vascular/Lymphatic: No evidence of aneurysm or adenopathy. Reproductive:  Uterus and adnexa unremarkable.  No mass. Other: No free fluid or free air. Musculoskeletal: No acute bony abnormality. IMPRESSION: Area of decreased perfusion in the upper pole of the right kidney with low-density fluid collection in the upper pole extending into the subcapsular space. Findings concerning for focal area of infection/by nephritis with resulting abscess. Electronically Signed   By: Charlett Nose M.D.   On: 04/28/2023 19:33    Procedures Procedures    Medications Ordered in ED Medications  levofloxacin (LEVAQUIN) IVPB 750 mg (750 mg Intravenous New Bag/Given 04/28/23 2103)  iohexol (OMNIPAQUE) 300 MG/ML solution 100 mL (80 mLs Intravenous Contrast Given 04/28/23 1857)  ketorolac (TORADOL) 15 MG/ML injection 15 mg (15 mg Intravenous Given 04/28/23 2026)    ED Course/ Medical Decision Making/ A&P  Medical Decision Making Risk Prescription drug management.   This patient presents to the ED for concern of flank pain.  Differential diagnosis includes urolithiasis, pyelonephritis, cystitis   Lab Tests:  I Ordered, and personally interpreted labs.  The pertinent results include: CBC unremarkable, CMP unremarkable, hCG less than 1, lipase normal, UA with signs of infection   Imaging Studies ordered:  I ordered imaging studies including CT abdomen pelvis I independently visualized and interpreted imaging which showed Area of decreased perfusion in the upper pole of the right kidney with low-density fluid collection in the upper pole extending into the subcapsular space. Findings concerning for focal area of infection/by nephritis with resulting abscess. I agree with the radiologist interpretation   Medicines ordered and prescription drug management:  I ordered medication including Toradol, Rocephin for pain, infection Reevaluation of the patient after these medicines showed that the patient improved I have reviewed the patients home medicines  and have made adjustments as needed   Problem List / ED Course:  Patient presents to the emergency department today with concerns of flank pain.  Reports that she began to notice right-sided flank pain started yesterday.  Reports prior history of some bladder infections and a tubal ligation reversal earlier this year in January.  At this time, states that pain is well-managed although she did take a dose of hydromorphone prior to coming to the emergency department.  She denies any obvious blood in her urine but states that her urine has been darker. On exam, there is no focal abdominal tenderness.  Right flank/CVA tenderness present.  Left flank unremarkable.  Concern at this time is for pyelonephritis versus urolithiasis.  Will obtain labs and imaging. CT abdomen pelvis concerning with an area of decreased perfusion in the upper pole of the right kidney with fluid collection extending into the subcapsular space.  This area is concerning for infection with a resulting abscess.  Given this finding, will consult urology for recommendations. Spoke with Dr. Laverle Patter, urology, who advised started patient on fluoroquinolone antibiotics for 6 weeks with plans for close outpatient follow up with their service. Patient given an initial dose of levofloxacin and tolerated. Informed patient of recommendations from urology.  She is on board with this plan.  Will send a prescription for levofloxacin for use at home.  I did discuss potential adverse side effects of fluoroquinolone antibiotics including GI conditions such as C. difficile, tendinitis, tendon rupture, etc., and patient will plan on taking antibiotics with attending food to reduce risk of some side effects.  Will also send patient home with a prescription for Percocet for pain and Zofran for nausea as needed.  Advised strict return precautions such as development of fever, worsening pain, or intractable nausea and vomiting.  Patient verbalized understanding all  return precautions.  Discharged home in stable condition.  Final Clinical Impression(s) / ED Diagnoses Final diagnoses:  Renal abscess, right    Rx / DC Orders ED Discharge Orders          Ordered    levofloxacin (LEVAQUIN) 500 MG tablet  Daily        04/28/23 2134    oxyCODONE-acetaminophen (PERCOCET/ROXICET) 5-325 MG tablet  Every 6 hours PRN        04/28/23 2134    ondansetron (ZOFRAN-ODT) 4 MG disintegrating tablet  Every 8 hours PRN        04/28/23 2134              Smitty Knudsen, PA-C 04/28/23 2144  Franne Forts, DO 04/29/23 2018

## 2023-04-28 NOTE — ED Triage Notes (Signed)
 C/o Left sided flank pain since yesterday. Hx hx bladder infection after tubule reversal surgery at end of january.  Denies fevers.

## 2023-04-28 NOTE — ED Notes (Signed)
 Discharge instructions regarding renal abscess were reviewed. Pt was instructed to follow up with PCP and urology and to return with any new or worsening symptoms

## 2023-04-29 LAB — URINE CULTURE: Culture: NO GROWTH

## 2023-06-10 ENCOUNTER — Encounter: Payer: Self-pay | Admitting: Urology

## 2023-06-10 ENCOUNTER — Ambulatory Visit (INDEPENDENT_AMBULATORY_CARE_PROVIDER_SITE_OTHER): Admitting: Urology

## 2023-06-10 VITALS — BP 130/86 | HR 92 | Ht 67.0 in | Wt 135.0 lb

## 2023-06-10 DIAGNOSIS — N281 Cyst of kidney, acquired: Secondary | ICD-10-CM | POA: Diagnosis not present

## 2023-06-10 DIAGNOSIS — N151 Renal and perinephric abscess: Secondary | ICD-10-CM | POA: Insufficient documentation

## 2023-06-10 LAB — URINALYSIS, ROUTINE W REFLEX MICROSCOPIC
Bilirubin, UA: NEGATIVE
Glucose, UA: NEGATIVE
Leukocytes,UA: NEGATIVE
Nitrite, UA: NEGATIVE
Protein,UA: NEGATIVE
Specific Gravity, UA: 1.02 (ref 1.005–1.030)
Urobilinogen, Ur: 1 mg/dL (ref 0.2–1.0)
pH, UA: 5.5 (ref 5.0–7.5)

## 2023-06-10 LAB — MICROSCOPIC EXAMINATION

## 2023-06-10 NOTE — Progress Notes (Signed)
 Assessment: 1. Abscess, renal/perirenal - possible, right   2. Renal cyst, right     Plan: I personally reviewed the patient's chart including provider notes, lab and imaging results. I personally reviewed the CT study from 04/28/2023 with results as noted below. Recommend follow-up with CT abdomen with and without contrast. Will contact with results and to arrange follow-up.  Chief Complaint:  Chief Complaint  Patient presents with   renal abscess    History of Present Illness:  Sharon Cortez is a 38 y.o. female who is seen for evaluation of possible renal abscess. She was initially treated for a UTI following a tubal reversal 1 week prior.  She was seen in the emergency room at [facility] on 03/22/2023 with right lower quadrant pain.  Urinalysis at that time showed 2+ bacteria, 5-10 RBCs.  No culture was obtained.  CT showed mildly complex cyst involving the right kidney measuring 3.3 x 3.8 cm, urinary bladder wall thickening with surrounding stranding.  She was treated with cephalexin x 7 days. She was seen in the emergency room on 04/28/2023 with right sided flank pain.  Urinalysis demonstrated >50 RBCs and >50 WBCs.  Urine culture showed no growth. CT abdomen and pelvis with contrast from 04/28/2023 showed a cystic area in the right upper pole measuring 1.9 cm surrounded by decreased perfusion suspicious for infection/pyelonephritis with resulting abscess; this area extended into the subcapsular space where a fluid collection measured 2.5 x 1.2 cm.  No other abnormalities were noted.  She completed Levaquin x 42 days.  She has not had any urinary symptoms.  No dysuria or gross hematuria.  She reports only occasional mild right sided flank discomfort.  No fevers or chills.  Past Medical History:  Past Medical History:  Diagnosis Date   Anemia    Heart murmur     Past Surgical History:  Past Surgical History:  Procedure Laterality Date   CESAREAN SECTION  2017   TUBAL LIGATION       Allergies:  No Known Allergies  Family History:  Family History  Problem Relation Age of Onset   Stroke Mother    Diabetes Father    Hypertension Father    Hyperlipidemia Father    Seizures Paternal Aunt    Diabetes Paternal Grandmother     Social History:  Social History   Tobacco Use   Smoking status: Never   Smokeless tobacco: Never  Vaping Use   Vaping status: Never Used  Substance Use Topics   Alcohol use: Yes    Comment: 2 drinks/month   Drug use: No    Review of symptoms:  Constitutional:  Negative for unexplained weight loss, night sweats, fever, chills ENT:  Negative for nose bleeds, sinus pain, painful swallowing CV:  Negative for chest pain, shortness of breath, exercise intolerance, palpitations, loss of consciousness Resp:  Negative for cough, wheezing, shortness of breath GI:  Negative for nausea, vomiting, diarrhea, bloody stools GU:  Positives noted in HPI; otherwise negative for gross hematuria, dysuria, urinary incontinence Neuro:  Negative for seizures, poor balance, limb weakness, slurred speech Psych:  Negative for lack of energy, depression, anxiety Endocrine:  Negative for polydipsia, polyuria, symptoms of hypoglycemia (dizziness, hunger, sweating) Hematologic:  Negative for anemia, purpura, petechia, prolonged or excessive bleeding, use of anticoagulants  Allergic:  Negative for difficulty breathing or choking as a result of exposure to anything; no shellfish allergy; no allergic response (rash/itch) to materials, foods  Physical exam: BP 130/86   Pulse 92  Ht 5\' 7"  (1.702 m)   Wt 135 lb (61.2 kg)   BMI 21.14 kg/m  GENERAL APPEARANCE:  Well appearing, well developed, well nourished, NAD HEENT: Atraumatic, Normocephalic, oropharynx clear. NECK: Supple without lymphadenopathy or thyromegaly. LUNGS: Clear to auscultation bilaterally. HEART: Regular Rate and Rhythm without murmurs, gallops, or rubs. ABDOMEN: Soft, non-tender, No  Masses. EXTREMITIES: Moves all extremities well.  Without clubbing, cyanosis, or edema. NEUROLOGIC:  Alert and oriented x 3, normal gait, CN II-XII grossly intact.  MENTAL STATUS:  Appropriate. BACK:  Non-tender to palpation.  No CVAT SKIN:  Warm, dry and intact.    Results: U/A: 0-5 WBCs, 0-2 RBCs

## 2023-06-25 ENCOUNTER — Ambulatory Visit (HOSPITAL_BASED_OUTPATIENT_CLINIC_OR_DEPARTMENT_OTHER)
Admission: RE | Admit: 2023-06-25 | Discharge: 2023-06-25 | Disposition: A | Discharge status: Home / Self Care | Source: Ambulatory Visit | Attending: Urology | Admitting: Urology

## 2023-06-25 DIAGNOSIS — N151 Renal and perinephric abscess: Secondary | ICD-10-CM | POA: Insufficient documentation

## 2023-06-25 MED ORDER — IOHEXOL 350 MG/ML SOLN
100.0000 mL | Freq: Once | INTRAVENOUS | Status: AC | PRN
  Administered 2023-06-25: 100 mL via INTRAVENOUS

## 2023-07-14 ENCOUNTER — Ambulatory Visit: Payer: Self-pay | Admitting: Urology

## 2023-07-14 DIAGNOSIS — N151 Renal and perinephric abscess: Secondary | ICD-10-CM

## 2023-07-21 ENCOUNTER — Encounter: Payer: Self-pay | Admitting: Urology

## 2023-08-09 ENCOUNTER — Ambulatory Visit (HOSPITAL_BASED_OUTPATIENT_CLINIC_OR_DEPARTMENT_OTHER)

## 2023-08-23 ENCOUNTER — Ambulatory Visit (HOSPITAL_BASED_OUTPATIENT_CLINIC_OR_DEPARTMENT_OTHER): Admission: RE | Admit: 2023-08-23 | Source: Ambulatory Visit

## 2023-10-15 ENCOUNTER — Encounter (HOSPITAL_BASED_OUTPATIENT_CLINIC_OR_DEPARTMENT_OTHER): Payer: Self-pay

## 2023-10-15 ENCOUNTER — Other Ambulatory Visit: Payer: Self-pay

## 2023-10-15 DIAGNOSIS — R102 Pelvic and perineal pain: Secondary | ICD-10-CM | POA: Diagnosis present

## 2023-10-15 DIAGNOSIS — R059 Cough, unspecified: Secondary | ICD-10-CM | POA: Diagnosis not present

## 2023-10-15 DIAGNOSIS — R110 Nausea: Secondary | ICD-10-CM | POA: Insufficient documentation

## 2023-10-15 DIAGNOSIS — Z5321 Procedure and treatment not carried out due to patient leaving prior to being seen by health care provider: Secondary | ICD-10-CM | POA: Diagnosis not present

## 2023-10-15 LAB — BASIC METABOLIC PANEL WITH GFR
Anion gap: 12 (ref 5–15)
BUN: 9 mg/dL (ref 6–20)
CO2: 28 mmol/L (ref 22–32)
Calcium: 10.9 mg/dL — ABNORMAL HIGH (ref 8.9–10.3)
Chloride: 101 mmol/L (ref 98–111)
Creatinine, Ser: 0.88 mg/dL (ref 0.44–1.00)
GFR, Estimated: >60 mL/min (ref >60)
Glucose, Bld: 82 mg/dL (ref 70–99)
Potassium: 4 mmol/L (ref 3.5–5.1)
Sodium: 141 mmol/L (ref 135–145)

## 2023-10-15 LAB — URINALYSIS, ROUTINE W REFLEX MICROSCOPIC
Bilirubin Urine: NEGATIVE
Glucose, UA: NEGATIVE mg/dL
Hgb urine dipstick: NEGATIVE
Ketones, ur: NEGATIVE mg/dL
Leukocytes,Ua: NEGATIVE
Nitrite: NEGATIVE
Protein, ur: NEGATIVE mg/dL
Specific Gravity, Urine: 1.016 (ref 1.005–1.030)
pH: 6.5 (ref 5.0–8.0)

## 2023-10-15 LAB — CBC
HCT: 37 % (ref 36.0–46.0)
Hemoglobin: 12.3 g/dL (ref 12.0–15.0)
MCH: 27.7 pg (ref 26.0–34.0)
MCHC: 33.2 g/dL (ref 30.0–36.0)
MCV: 83.3 fL (ref 80.0–100.0)
Platelets: 403 K/uL — ABNORMAL HIGH (ref 150–400)
RBC: 4.44 MIL/uL (ref 3.87–5.11)
RDW: 12.5 % (ref 11.5–15.5)
WBC: 5.1 K/uL (ref 4.0–10.5)
nRBC: 0 % (ref 0.0–0.2)

## 2023-10-15 LAB — HCG, SERUM, QUALITATIVE: Preg, Serum: NEGATIVE

## 2023-10-15 NOTE — ED Triage Notes (Signed)
 Pt reports  R pelvic pain and nausea starting yesterday. Pt unsure if pregnant but is actively trying. Pt also reports cough. Denies any urinary s/s. Pt reports hx of tubal ligation reversal.

## 2023-10-16 ENCOUNTER — Emergency Department (HOSPITAL_BASED_OUTPATIENT_CLINIC_OR_DEPARTMENT_OTHER)
Admission: EM | Admit: 2023-10-16 | Discharge: 2023-10-16 | Discharge status: Against Medical Advice | Attending: Emergency Medicine | Admitting: Emergency Medicine
